# Patient Record
Sex: Female | Born: 1935 | Race: Black or African American | Hispanic: No | Smoking: Never smoker
Health system: Southern US, Community
[De-identification: ages and names within clinical notes are randomized; demographics above are authoritative.]

## PROBLEM LIST (undated history)

## (undated) DIAGNOSIS — R011 Cardiac murmur, unspecified: Secondary | ICD-10-CM

## (undated) DIAGNOSIS — T7840XA Allergy, unspecified, initial encounter: Secondary | ICD-10-CM

## (undated) DIAGNOSIS — I2510 Atherosclerotic heart disease of native coronary artery without angina pectoris: Secondary | ICD-10-CM

## (undated) DIAGNOSIS — I10 Essential (primary) hypertension: Secondary | ICD-10-CM

## (undated) DIAGNOSIS — K5790 Diverticulosis of intestine, part unspecified, without perforation or abscess without bleeding: Secondary | ICD-10-CM

## (undated) DIAGNOSIS — R202 Paresthesia of skin: Secondary | ICD-10-CM

## (undated) DIAGNOSIS — H9313 Tinnitus, bilateral: Secondary | ICD-10-CM

## (undated) DIAGNOSIS — H40009 Preglaucoma, unspecified, unspecified eye: Secondary | ICD-10-CM

## (undated) DIAGNOSIS — R413 Other amnesia: Secondary | ICD-10-CM

## (undated) DIAGNOSIS — Z8619 Personal history of other infectious and parasitic diseases: Secondary | ICD-10-CM

## (undated) DIAGNOSIS — R7303 Prediabetes: Secondary | ICD-10-CM

## (undated) DIAGNOSIS — R634 Abnormal weight loss: Secondary | ICD-10-CM

## (undated) HISTORY — DX: Personal history of other infectious and parasitic diseases: Z86.19

## (undated) HISTORY — DX: Paresthesia of skin: R20.2

## (undated) HISTORY — DX: Tinnitus, bilateral: H93.13

## (undated) HISTORY — DX: Abnormal weight loss: R63.4

## (undated) HISTORY — DX: Cardiac murmur, unspecified: R01.1

## (undated) HISTORY — DX: Atherosclerotic heart disease of native coronary artery without angina pectoris: I25.10

## (undated) HISTORY — DX: Prediabetes: R73.03

## (undated) HISTORY — DX: Allergy, unspecified, initial encounter: T78.40XA

## (undated) HISTORY — DX: Other amnesia: R41.3

## (undated) HISTORY — DX: Essential (primary) hypertension: I10

## (undated) HISTORY — DX: Diverticulosis of intestine, part unspecified, without perforation or abscess without bleeding: K57.90

## (undated) HISTORY — DX: Preglaucoma, unspecified, unspecified eye: H40.009

---

## 2002-01-12 LAB — HM COLONOSCOPY

## 2007-11-30 LAB — HM DEXA SCAN

## 2007-12-12 ENCOUNTER — Ambulatory Visit: Payer: Self-pay | Admitting: Family Medicine

## 2007-12-13 ENCOUNTER — Ambulatory Visit: Payer: Self-pay | Admitting: Internal Medicine

## 2008-04-12 DIAGNOSIS — R634 Abnormal weight loss: Secondary | ICD-10-CM

## 2009-11-12 ENCOUNTER — Ambulatory Visit: Payer: Self-pay | Admitting: Family Medicine

## 2010-09-20 LAB — HM MAMMOGRAPHY: HM Mammogram: NORMAL

## 2010-09-30 ENCOUNTER — Ambulatory Visit: Payer: Self-pay | Admitting: Family Medicine

## 2012-01-12 ENCOUNTER — Emergency Department: Payer: Self-pay | Admitting: Emergency Medicine

## 2013-04-19 LAB — LIPID PANEL
Cholesterol: 171 mg/dL (ref 0–200)
HDL: 103 mg/dL — AB (ref 35–70)
LDL Cholesterol: 59 mg/dL
Triglycerides: 46 mg/dL (ref 40–160)

## 2013-04-19 LAB — HEMOGLOBIN A1C: Hgb A1c MFr Bld: 6.3 % — AB (ref 4.0–6.0)

## 2013-11-30 ENCOUNTER — Emergency Department: Payer: Self-pay | Admitting: Emergency Medicine

## 2013-11-30 LAB — CBC WITH DIFFERENTIAL/PLATELET
Basophil #: 0.1 10*3/uL (ref 0.0–0.1)
Basophil %: 1 %
Eosinophil #: 0 10*3/uL (ref 0.0–0.7)
Eosinophil %: 0.1 %
HCT: 35.5 % (ref 35.0–47.0)
HGB: 11.8 g/dL — ABNORMAL LOW (ref 12.0–16.0)
LYMPHS ABS: 1.4 10*3/uL (ref 1.0–3.6)
Lymphocyte %: 15.5 %
MCH: 30.2 pg (ref 26.0–34.0)
MCHC: 33.3 g/dL (ref 32.0–36.0)
MCV: 91 fL (ref 80–100)
MONOS PCT: 8.5 %
Monocyte #: 0.8 x10 3/mm (ref 0.2–0.9)
NEUTROS ABS: 6.8 10*3/uL — AB (ref 1.4–6.5)
Neutrophil %: 74.9 %
PLATELETS: 234 10*3/uL (ref 150–440)
RBC: 3.91 10*6/uL (ref 3.80–5.20)
RDW: 13.3 % (ref 11.5–14.5)
WBC: 9.1 10*3/uL (ref 3.6–11.0)

## 2014-12-18 ENCOUNTER — Encounter: Payer: Self-pay | Admitting: Family Medicine

## 2014-12-18 DIAGNOSIS — R202 Paresthesia of skin: Secondary | ICD-10-CM | POA: Insufficient documentation

## 2014-12-18 DIAGNOSIS — H9313 Tinnitus, bilateral: Secondary | ICD-10-CM | POA: Insufficient documentation

## 2014-12-18 DIAGNOSIS — E785 Hyperlipidemia, unspecified: Secondary | ICD-10-CM | POA: Insufficient documentation

## 2014-12-18 DIAGNOSIS — R7303 Prediabetes: Secondary | ICD-10-CM | POA: Insufficient documentation

## 2014-12-18 DIAGNOSIS — H40009 Preglaucoma, unspecified, unspecified eye: Secondary | ICD-10-CM | POA: Insufficient documentation

## 2014-12-18 DIAGNOSIS — K5730 Diverticulosis of large intestine without perforation or abscess without bleeding: Secondary | ICD-10-CM | POA: Insufficient documentation

## 2014-12-18 DIAGNOSIS — D72819 Decreased white blood cell count, unspecified: Secondary | ICD-10-CM | POA: Insufficient documentation

## 2014-12-18 DIAGNOSIS — I2510 Atherosclerotic heart disease of native coronary artery without angina pectoris: Secondary | ICD-10-CM | POA: Insufficient documentation

## 2014-12-18 DIAGNOSIS — R011 Cardiac murmur, unspecified: Secondary | ICD-10-CM | POA: Insufficient documentation

## 2014-12-18 DIAGNOSIS — R000 Tachycardia, unspecified: Secondary | ICD-10-CM | POA: Insufficient documentation

## 2014-12-18 DIAGNOSIS — I10 Essential (primary) hypertension: Secondary | ICD-10-CM | POA: Insufficient documentation

## 2014-12-18 DIAGNOSIS — J309 Allergic rhinitis, unspecified: Secondary | ICD-10-CM | POA: Insufficient documentation

## 2014-12-18 DIAGNOSIS — F0390 Unspecified dementia without behavioral disturbance: Secondary | ICD-10-CM | POA: Insufficient documentation

## 2014-12-18 DIAGNOSIS — K219 Gastro-esophageal reflux disease without esophagitis: Secondary | ICD-10-CM | POA: Insufficient documentation

## 2014-12-18 DIAGNOSIS — Z8619 Personal history of other infectious and parasitic diseases: Secondary | ICD-10-CM | POA: Insufficient documentation

## 2014-12-19 ENCOUNTER — Ambulatory Visit (INDEPENDENT_AMBULATORY_CARE_PROVIDER_SITE_OTHER): Payer: Federal, State, Local not specified - PPO | Admitting: Family Medicine

## 2014-12-19 ENCOUNTER — Encounter: Payer: Self-pay | Admitting: Family Medicine

## 2014-12-19 VITALS — BP 134/76 | HR 84 | Temp 98.2°F | Resp 18 | Ht 59.0 in | Wt 114.1 lb

## 2014-12-19 DIAGNOSIS — R202 Paresthesia of skin: Secondary | ICD-10-CM

## 2014-12-19 DIAGNOSIS — D72819 Decreased white blood cell count, unspecified: Secondary | ICD-10-CM

## 2014-12-19 DIAGNOSIS — F0391 Unspecified dementia with behavioral disturbance: Secondary | ICD-10-CM | POA: Diagnosis not present

## 2014-12-19 DIAGNOSIS — K219 Gastro-esophageal reflux disease without esophagitis: Secondary | ICD-10-CM | POA: Diagnosis not present

## 2014-12-19 DIAGNOSIS — I10 Essential (primary) hypertension: Secondary | ICD-10-CM | POA: Diagnosis not present

## 2014-12-19 DIAGNOSIS — E785 Hyperlipidemia, unspecified: Secondary | ICD-10-CM | POA: Diagnosis not present

## 2014-12-19 MED ORDER — OLMESARTAN-AMLODIPINE-HCTZ 40-10-25 MG PO TABS: 1.0000 | ORAL_TABLET | Freq: Every day | ORAL | Status: DC

## 2014-12-19 MED ORDER — LABETALOL HCL 200 MG PO TABS: 200.0000 mg | ORAL_TABLET | Freq: Two times a day (BID) | ORAL | Status: DC

## 2014-12-19 NOTE — Progress Notes (Signed)
Name: Christine LemmingHelen L Lin   MRN: [id number]    DOB: 1932/06/07   Date:12/19/2014       Progress Note  Subjective  Chief Complaint  Chief Complaint  Patient presents with  . Hand Pain    Bilateral hand pain constant for the past 6 month feels like sandpaper, patient could not grip her hands all the way close. Feels like the pain is radiating up her arms and feels tingling up her arms.  . Memory Loss    worstening with time, weather, seasons.    HPI  Dementia with behavior problems: she moved in with one of her daughter's ( Paulette) about 7 months ago. She has been forgetting the stove one, hides her food, has episodes of confusion and irritability. No bowel or bladder incontinence. She stays home by herself during the day , but has not been wondering. Stays inside the house. Symptoms are getting progressively worse. We have been ordering labs since last year but never got it done. She has gained weight since moved in with her daughter  Paresthesia: both hands feels like sandpaper, going on for over one year. B12 was ordered by never done.   HTN: taking medications by herself. Daughter checks after her. BP has been at goal, denies hypotension  GERD: she has stopped taking Ranitidine, denies GERD symptoms    Patient Active Problem List   Diagnosis Date Noted  . Cardiovascular disease 12/18/2014  . Diverticulosis of colon 12/18/2014  . Acid reflux 12/18/2014  . Benign essential HTN 12/18/2014  . Glaucoma suspect 12/18/2014  . Dementia 12/18/2014  . Hand paresthesia 12/18/2014  . Cardiac murmur 12/18/2014  . History of shingles 12/18/2014  . HLD (hyperlipidemia) 12/18/2014  . Leukocytopenia 12/18/2014  . Borderline diabetes 12/18/2014  . Allergic rhinitis 12/18/2014  . Bilateral tinnitus 12/18/2014  . Decreased body weight 04/12/2008    History reviewed. No pertinent past surgical history.  Family History  Problem Relation Age of Onset  . Cancer Mother   . Cancer Sister      Brain  . Heart disease Brother   . Diabetes Daughter   . Hypertension Daughter     History   Social History  . Marital Status: Widowed    Spouse Name: N/A  . Number of Children: N/A  . Years of Education: N/A   Occupational History  . Not on file.   Social History Main Topics  . Smoking status: Never Smoker   . Smokeless tobacco: Never Used  . Alcohol Use: No  . Drug Use: No  . Sexual Activity: No   Other Topics Concern  . Not on file   Social History Narrative     Current outpatient prescriptions:  .  Cholecalciferol (VITAMIN D) 2000 UNITS CAPS, Take by mouth., Disp: , Rfl:  .  diclofenac sodium (VOLTAREN) 1 % GEL, Place onto the skin., Disp: , Rfl:  .  labetalol (NORMODYNE) 200 MG tablet, Take 1 tablet (200 mg total) by mouth 2 (two) times daily., Disp: 60 tablet, Rfl: 5 .  loratadine (CLARITIN) 10 MG tablet, Take by mouth., Disp: , Rfl:  .  Olmesartan-Amlodipine-HCTZ (TRIBENZOR) 40-10-25 MG TABS, Take 1 tablet by mouth daily., Disp: 30 tablet, Rfl: 5 .  mirtazapine (REMERON) 15 MG tablet, Take by mouth., Disp: , Rfl:   Allergies  Allergen Reactions  . Sulfa Antibiotics      ROS  Constitutional: Negative for fever positive for  weight change - gain.  Respiratory: Negative for cough and shortness  of breath.   Cardiovascular: Negative for chest pain or palpitations.  Gastrointestinal: Negative for abdominal pain, no bowel changes.  Musculoskeletal: Negative for gait problem or joint swelling.  Skin: Negative for rash.  Neurological: Negative for dizziness ( occasionally when she gets up quickly )or headache.  No other specific complaints in a complete review of systems (except as listed in HPI above).  Objective  Filed Vitals:   12/19/14 1124 12/19/14 1218  BP: 134/76   Pulse: 110 84  Temp: 98.2 F (36.8 C)   TempSrc: Oral   Resp: 18   Height:  (1.499 m)   Weight: 114 lb 1.6 oz (51.755 kg)   SpO2: 98%     Body mass index is 23.03  kg/(m^2).  Physical Exam  Constitutional: Patient appears well-developed and well-nourished. No distress.  Eyes:  No scleral icterus.  Neck: Normal range of motion. Neck supple. Cardiovascular: Normal rate, regular rhythm and normal heart sounds.  No murmur heard. No BLE edema. Pulmonary/Chest: Effort normal and breath sounds normal. No respiratory distress. Abdominal: Soft.  There is no tenderness. Psychiatric: Patient has a normal mood and affect. behavior is normal. She was confused given some of the information.   PHQ2/9: Depression screen PHQ 2/9 12/19/2014  Decreased Interest 0  Down, Depressed, Hopeless 0  PHQ - 2 Score 0   MMS test: 22  Fall Risk: Fall Risk  12/19/2014  Falls in the past year? No     Assessment & Plan  1. Leukocytopenia  - CBC with Differential  2. HLD (hyperlipidemia) Not fasting, check next time  3. Hand paresthesia, unspecified laterality  - B12  4. Benign essential HTN Occasionally gets dizzy, advised to get up slowly, may dc labetolol if symptoms persists - Comprehensive Metabolic Panel (CMET) - Olmesartan-Amlodipine-HCTZ (TRIBENZOR) 40-10-25 MG TABS; Take 1 tablet by mouth daily.  Dispense: 30 tablet; Refill: 5 - labetalol (NORMODYNE) 200 MG tablet; Take 1 tablet (200 mg total) by mouth 2 (two) times daily.  Dispense: 60 tablet; Refill: 5  5. Gastroesophageal reflux disease without esophagitis Doing well at this time  6. Dementia, with behavioral disturbance Discussed nursing home placement with her daughter Yehuda Mao ), explained the risk of her being home alone. She states her mother would not accept that, but explained to her it will be a safer environment for her. We will check labs before starting her on medication. Advised her to seek support groups and we will try getting some home health assistance for her  - TSH - B12 - RPR - Ambulatory referral to Home Health

## 2014-12-19 NOTE — Patient Instructions (Signed)

## 2014-12-20 ENCOUNTER — Other Ambulatory Visit: Payer: Self-pay | Admitting: Family Medicine

## 2014-12-20 LAB — COMPREHENSIVE METABOLIC PANEL
ALBUMIN: 4.3 g/dL (ref 3.5–4.7)
ALK PHOS: 51 IU/L (ref 39–117)
ALT: 13 IU/L (ref 0–32)
AST: 21 IU/L (ref 0–40)
Albumin/Globulin Ratio: 1.2 (ref 1.1–2.5)
BILIRUBIN TOTAL: 0.6 mg/dL (ref 0.0–1.2)
BUN / CREAT RATIO: 22 (ref 11–26)
BUN: 21 mg/dL (ref 8–27)
CO2: 33 mmol/L — ABNORMAL HIGH (ref 18–29)
CREATININE: 0.94 mg/dL (ref 0.57–1.00)
Calcium: 9.8 mg/dL (ref 8.7–10.3)
Chloride: 94 mmol/L — ABNORMAL LOW (ref 97–108)
GFR calc non Af Amer: 57 mL/min/{1.73_m2} — ABNORMAL LOW (ref >59)
GFR, EST AFRICAN AMERICAN: 66 mL/min/{1.73_m2} (ref >59)
Globulin, Total: 3.5 g/dL (ref 1.5–4.5)
Glucose: 100 mg/dL — ABNORMAL HIGH (ref 65–99)
Potassium: 4.6 mmol/L (ref 3.5–5.2)
Sodium: 142 mmol/L (ref 134–144)
Total Protein: 7.8 g/dL (ref 6.0–8.5)

## 2014-12-20 LAB — CBC WITH DIFFERENTIAL/PLATELET
BASOS: 1 %
Basophils Absolute: 0 10*3/uL (ref 0.0–0.2)
EOS (ABSOLUTE): 0 10*3/uL (ref 0.0–0.4)
EOS: 1 %
Hematocrit: 39.9 % (ref 34.0–46.6)
Hemoglobin: 13.1 g/dL (ref 11.1–15.9)
IMMATURE GRANS (ABS): 0 10*3/uL (ref 0.0–0.1)
Immature Granulocytes: 0 %
Lymphocytes Absolute: 1.6 10*3/uL (ref 0.7–3.1)
Lymphs: 41 %
MCH: 29.8 pg (ref 26.6–33.0)
MCHC: 32.8 g/dL (ref 31.5–35.7)
MCV: 91 fL (ref 79–97)
MONOS ABS: 0.3 10*3/uL (ref 0.1–0.9)
Monocytes: 7 %
NEUTROS PCT: 50 %
Neutrophils Absolute: 2 10*3/uL (ref 1.4–7.0)
Platelets: 191 10*3/uL (ref 150–379)
RBC: 4.39 x10E6/uL (ref 3.77–5.28)
RDW: 13.7 % (ref 12.3–15.4)
WBC: 4 10*3/uL (ref 3.4–10.8)

## 2014-12-20 LAB — VITAMIN B12: VITAMIN B 12: 1242 pg/mL — AB (ref 211–946)

## 2014-12-20 LAB — RPR: RPR: NONREACTIVE

## 2014-12-20 LAB — TSH: TSH: 1.63 u[IU]/mL (ref 0.450–4.500)

## 2014-12-20 MED ORDER — DONEPEZIL HCL 5 MG PO TABS: 5.0000 mg | ORAL_TABLET | Freq: Every day | ORAL | Status: DC

## 2014-12-20 NOTE — Telephone Encounter (Signed)
This dose of 5 mg is just for the first month, I can send 90days when I switch to 

## 2014-12-20 NOTE — Progress Notes (Signed)
Patient daughter notified of labs and will pick up new prescription for mother.

## 2014-12-20 NOTE — Telephone Encounter (Signed)
Patient is requesting a 90 day supply

## 2014-12-23 NOTE — Telephone Encounter (Signed)
Patient daughter notified and also called the pharmacy. The pharmacist states if it is on their record they usually get 90 day supply this message for a request of a 90 day supply is automatically sent.

## 2014-12-31 ENCOUNTER — Telehealth: Payer: Self-pay | Admitting: Family Medicine

## 2014-12-31 ENCOUNTER — Other Ambulatory Visit: Payer: Self-pay | Admitting: Family Medicine

## 2014-12-31 MED ORDER — DICLOFENAC SODIUM 1 % TD GEL: 4.0000 g | Freq: Four times a day (QID) | TRANSDERMAL | Status: DC

## 2014-12-31 NOTE — Telephone Encounter (Signed)
[name] (Daughter) states Donnamarie Poagthat Dr. Carlynn PurlSowles was going to prescribe a cream for the patient hand. States that this is something that the patient has been on before. Please send to [facility] [phone]

## 2015-02-05 ENCOUNTER — Ambulatory Visit: Payer: Federal, State, Local not specified - PPO | Admitting: Family Medicine

## 2015-06-05 ENCOUNTER — Emergency Department: Payer: Medicare (Managed Care)

## 2015-06-05 ENCOUNTER — Other Ambulatory Visit: Payer: Self-pay

## 2015-06-05 DIAGNOSIS — I10 Essential (primary) hypertension: Secondary | ICD-10-CM | POA: Insufficient documentation

## 2015-06-05 DIAGNOSIS — S01112A Laceration without foreign body of left eyelid and periocular area, initial encounter: Secondary | ICD-10-CM | POA: Diagnosis not present

## 2015-06-05 DIAGNOSIS — N390 Urinary tract infection, site not specified: Secondary | ICD-10-CM | POA: Insufficient documentation

## 2015-06-05 DIAGNOSIS — Y9389 Activity, other specified: Secondary | ICD-10-CM | POA: Insufficient documentation

## 2015-06-05 DIAGNOSIS — S0990XA Unspecified injury of head, initial encounter: Secondary | ICD-10-CM | POA: Diagnosis present

## 2015-06-05 DIAGNOSIS — Z79899 Other long term (current) drug therapy: Secondary | ICD-10-CM | POA: Insufficient documentation

## 2015-06-05 DIAGNOSIS — W01198A Fall on same level from slipping, tripping and stumbling with subsequent striking against other object, initial encounter: Secondary | ICD-10-CM | POA: Insufficient documentation

## 2015-06-05 DIAGNOSIS — Y998 Other external cause status: Secondary | ICD-10-CM | POA: Diagnosis not present

## 2015-06-05 DIAGNOSIS — Z791 Long term (current) use of non-steroidal anti-inflammatories (NSAID): Secondary | ICD-10-CM | POA: Insufficient documentation

## 2015-06-05 DIAGNOSIS — E860 Dehydration: Secondary | ICD-10-CM | POA: Diagnosis not present

## 2015-06-05 DIAGNOSIS — Y9289 Other specified places as the place of occurrence of the external cause: Secondary | ICD-10-CM | POA: Insufficient documentation

## 2015-06-05 LAB — CBC
HEMATOCRIT: 34.6 % — AB (ref 35.0–47.0)
Hemoglobin: 11.3 g/dL — ABNORMAL LOW (ref 12.0–16.0)
MCH: 29.1 pg (ref 26.0–34.0)
MCHC: 32.6 g/dL (ref 32.0–36.0)
MCV: 89.1 fL (ref 80.0–100.0)
PLATELETS: 153 10*3/uL (ref 150–440)
RBC: 3.88 MIL/uL (ref 3.80–5.20)
RDW: 13.8 % (ref 11.5–14.5)
WBC: 4.7 10*3/uL (ref 3.6–11.0)

## 2015-06-05 LAB — BASIC METABOLIC PANEL
Anion gap: 10 (ref 5–15)
BUN: 49 mg/dL — AB (ref 6–20)
CO2: 32 mmol/L (ref 22–32)
Calcium: 9.5 mg/dL (ref 8.9–10.3)
Chloride: 99 mmol/L — ABNORMAL LOW (ref 101–111)
Creatinine, Ser: 2.25 mg/dL — ABNORMAL HIGH (ref 0.44–1.00)
GFR calc Af Amer: 22 mL/min — ABNORMAL LOW (ref >60)
GFR calc non Af Amer: 19 mL/min — ABNORMAL LOW (ref >60)
GLUCOSE: 148 mg/dL — AB (ref 65–99)
POTASSIUM: 4.5 mmol/L (ref 3.5–5.1)
Sodium: 141 mmol/L (ref 135–145)

## 2015-06-05 LAB — TROPONIN I: Troponin I: <0.03 ng/mL (ref <0.031)

## 2015-06-05 NOTE — ED Notes (Signed)
Pt in with co fall today, unsure of cause.  Does have dementia, lac noted to left eyebrow.  Pt denies any pain at this time, daughters states she has had dizziness recently.

## 2015-06-06 ENCOUNTER — Emergency Department
Admission: EM | Admit: 2015-06-06 | Discharge: 2015-06-06 | Disposition: A | Discharge status: Home / Self Care | Payer: Medicare (Managed Care) | Attending: Emergency Medicine | Admitting: Emergency Medicine

## 2015-06-06 DIAGNOSIS — N390 Urinary tract infection, site not specified: Secondary | ICD-10-CM

## 2015-06-06 DIAGNOSIS — W19XXXA Unspecified fall, initial encounter: Secondary | ICD-10-CM

## 2015-06-06 DIAGNOSIS — N289 Disorder of kidney and ureter, unspecified: Secondary | ICD-10-CM

## 2015-06-06 DIAGNOSIS — E860 Dehydration: Secondary | ICD-10-CM

## 2015-06-06 DIAGNOSIS — IMO0002 Reserved for concepts with insufficient information to code with codable children: Secondary | ICD-10-CM

## 2015-06-06 LAB — URINALYSIS COMPLETE WITH MICROSCOPIC (ARMC ONLY)
Bilirubin Urine: NEGATIVE
GLUCOSE, UA: 50 mg/dL — AB
HGB URINE DIPSTICK: NEGATIVE
KETONES UR: NEGATIVE mg/dL
Nitrite: NEGATIVE
Protein, ur: 30 mg/dL — AB
RBC / HPF: NONE SEEN RBC/hpf (ref 0–5)
SPECIFIC GRAVITY, URINE: 1.016 (ref 1.005–1.030)
pH: 6 (ref 5.0–8.0)

## 2015-06-06 MED ORDER — NITROFURANTOIN MONOHYD MACRO 100 MG PO CAPS
100.0000 mg | ORAL_CAPSULE | Freq: Once | ORAL | Status: AC
  Administered 2015-06-06: 100 mg via ORAL
  Filled 2015-06-06: qty 1

## 2015-06-06 MED ORDER — NITROFURANTOIN MONOHYD MACRO 100 MG PO CAPS: 100.0000 mg | ORAL_CAPSULE | Freq: Two times a day (BID) | ORAL | Status: DC

## 2015-06-06 MED ORDER — SODIUM CHLORIDE 0.9 % IV BOLUS (SEPSIS)
1000.0000 mL | Freq: Once | INTRAVENOUS | Status: AC
  Administered 2015-06-06: 1000 mL via INTRAVENOUS

## 2015-06-06 NOTE — Discharge Instructions (Signed)
1. Drink plenty of fluids daily. 2. Take antibiotics as prescribed (Macrobid 100 mg twice daily 7 days). 3. Medical tape should follow off in approximately one week; you may remove after 7 days. 4. Return to the ER for worsening symptoms, fever, persistent vomiting or other concerns.  Dehydration Dehydration is when you lose more fluids from the body than you take in. Vital organs such as the kidneys, brain, and heart cannot function without a proper amount of fluids and salt. Any loss of fluids from the body can cause dehydration.  Older adults are at a higher risk of dehydration than younger adults. As we age, our bodies are less able to conserve water and do not respond to temperature changes as well. Also, older adults do not become thirsty as easily or quickly. Because of this, older adults often do not realize they need to increase fluids to avoid dehydration.  CAUSES   Vomiting.  Diarrhea.  Excessive sweating.  Excessive urination.  Fever.  Certain medicines, such as blood pressure medicines called diuretics.  Poorly controlled blood sugars. SIGNS AND SYMPTOMS  Mild dehydration:  Thirst.  Dry lips.  Slightly dry mouth. Moderate dehydration:  Very dry mouth.  Sunken eyes.  Skin does not bounce back quickly when lightly pinched and released.  Dark urine and decreased urine production.  Decreased tear production.  Headache. Severe dehydration:  Very dry mouth.  Extreme thirst.  Rapid, weak pulse (more than 100 beats per minute at rest).  Cold hands and feet.  Not able to sweat in spite of heat.  Rapid breathing.  Blue lips.  Confusion and lethargy.  Difficulty being awakened.  Minimal urine production.  No tears. DIAGNOSIS  Your health care provider will diagnose dehydration based on your symptoms and your exam. Blood and urine tests will help confirm the diagnosis. The diagnostic evaluation should also identify the cause of  dehydration. TREATMENT  Treatment of mild or moderate dehydration can often be done at home by increasing the amount of fluids that you drink. It is best to drink small amounts of fluid more often. Drinking too much at one time can make vomiting worse. Severe dehydration needs to be treated at the hospital. You may be given IV fluids that contain water and electrolytes. HOME CARE INSTRUCTIONS   Ask your health care provider about specific rehydration instructions.  Drink enough fluids to keep your urine clear or pale yellow.  Drink small amounts frequently if you have nausea and vomiting.  Eat as you normally do.  Avoid:  Foods or drinks high in sugar.  Carbonated drinks.  Juice.  Extremely hot or cold fluids.  Drinks with caffeine.  Fatty, greasy foods.  Alcohol.  Tobacco.  Overeating.  Gelatin desserts.  Wash your hands well to avoid spreading bacteria and viruses.  Only take over-the-counter or prescription medicines for pain, discomfort, or fever as directed by your health care provider.  Ask your health care provider if you should continue all prescribed and over-the-counter medicines.  Keep all follow-up appointments with your health care provider. SEEK MEDICAL CARE IF:  You have abdominal pain, and it increases or stays in one area (localizes).  You have a rash, stiff neck, or severe headache.  You are irritable, sleepy, or difficult to awaken.  You are weak, dizzy, or extremely thirsty.  You have a fever. SEEK IMMEDIATE MEDICAL CARE IF:   You are unable to keep fluids down, or you get worse despite treatment.  You have frequent episodes of  vomiting or diarrhea.  You have blood or green matter (bile) in your vomit.  You have blood in your stool, or your stool looks black and tarry.  You have not urinated in 6-8 hours, or you have only urinated a small amount of very dark urine.  You faint. MAKE SURE YOU:   Understand these  instructions.  Will watch your condition.  Will get help right away if you are not doing well or get worse.   This information is not intended to replace advice given to you by your health care provider. Make sure you discuss any questions you have with your health care provider.   Document Released: 07-17-202005 Document Revised: 06/05/2013 Document Reviewed: 02/05/2013 Elsevier Interactive Patient Education 2016 Elsevier Inc.  Urinary Tract Infection Urinary tract infections (UTIs) can develop anywhere along your urinary tract. Your urinary tract is your body's drainage system for removing wastes and extra water. Your urinary tract includes two kidneys, two ureters, a bladder, and a urethra. Your kidneys are a pair of bean-shaped organs. Each kidney is about the size of your fist. They are located below your ribs, one on each side of your spine. CAUSES Infections are caused by microbes, which are microscopic organisms, including fungi, viruses, and bacteria. These organisms are so small that they can only be seen through a microscope. Bacteria are the microbes that most commonly cause UTIs. SYMPTOMS  Symptoms of UTIs may vary by age and gender of the patient and by the location of the infection. Symptoms in young women typically include a frequent and intense urge to urinate and a painful, burning feeling in the bladder or urethra during urination. Older women and men are more likely to be tired, shaky, and weak and have muscle aches and abdominal pain. A fever may mean the infection is in your kidneys. Other symptoms of a kidney infection include pain in your back or sides below the ribs, nausea, and vomiting. DIAGNOSIS To diagnose a UTI, your caregiver will ask you about your symptoms. Your caregiver will also ask you to provide a urine sample. The urine sample will be tested for bacteria and white blood cells. White blood cells are made by your body to help fight infection. TREATMENT   Typically, UTIs can be treated with medication. Because most UTIs are caused by a bacterial infection, they usually can be treated with the use of antibiotics. The choice of antibiotic and length of treatment depend on your symptoms and the type of bacteria causing your infection. HOME CARE INSTRUCTIONS  If you were prescribed antibiotics, take them exactly as your caregiver instructs you. Finish the medication even if you feel better after you have only taken some of the medication.  Drink enough water and fluids to keep your urine clear or pale yellow.  Avoid caffeine, tea, and carbonated beverages. They tend to irritate your bladder.  Empty your bladder often. Avoid holding urine for long periods of time.  Empty your bladder before and after sexual intercourse.  After a bowel movement, women should cleanse from front to back. Use each tissue only once. SEEK MEDICAL CARE IF:   You have back pain.  You develop a fever.  Your symptoms do not begin to resolve within 3 days. SEEK IMMEDIATE MEDICAL CARE IF:   You have severe back pain or lower abdominal pain.  You develop chills.  You have nausea or vomiting.  You have continued burning or discomfort with urination. MAKE SURE YOU:   Understand these instructions.  Will watch your condition.  Will get help right away if you are not doing well or get worse.   This information is not intended to replace advice given to you by your health care provider. Make sure you discuss any questions you have with your health care provider.   Document Released: 03/10/2005 Document Revised: 02/19/2015 Document Reviewed: 07/09/2011 Elsevier Interactive Patient Education [employer]2016 Elsevier Inc.

## 2015-06-06 NOTE — ED Provider Notes (Signed)
[facility] Emergency Department Provider Note  ____________________________________________  Time seen: Approximately 12:58 AM  I have reviewed the triage vital signs and the nursing notes.   HISTORY  Chief Complaint Head Injury    HPI Christine Lin is a 81 y.o. female presents to the ED from home with a chief complaint of fall with left eyebrow laceration. Patient states she was ambulating to her freezer to get some ice cream which she lost her balance and fell, striking her forehead resulting in left eyebrow laceration. Incident occurred approximately 9 PM. Daughters witnessed the fall and deny LOC. Patient voices no complaints of headache, dizziness, vision changes, chest pain, shortness of breath, abdominal pain, nausea, vomiting, diarrhea.Daughter states patient has been complaining of dizziness recently. Patient denies. Denies recent travel. Denies recent fever, chills, malaise, cough, dysuria.   Past Medical History  Diagnosis Date  . History of herpes zoster   . Heart murmur   . Allergy   . Hypertension   . Prediabetes   . Memory loss   . Hand paresthesia   . Tinnitus of both ears   . ASCVD (arteriosclerotic cardiovascular disease)   . Glaucoma suspect   . Diverticulosis   . Abnormal weight loss     Patient Active Problem List   Diagnosis Date Noted  . Cardiovascular disease 12/18/2014  . Diverticulosis of colon 12/18/2014  . Acid reflux 12/18/2014  . Benign essential HTN 12/18/2014  . Glaucoma suspect 12/18/2014  . Dementia 12/18/2014  . Hand paresthesia 12/18/2014  . Cardiac murmur 12/18/2014  . History of shingles 12/18/2014  . HLD (hyperlipidemia) 12/18/2014  . Leukocytopenia 12/18/2014  . Borderline diabetes 12/18/2014  . Allergic rhinitis 12/18/2014  . Bilateral tinnitus 12/18/2014  . Decreased body weight 04/12/2008    No past surgical history on file.  Current Outpatient Rx  Name  Route  Sig  Dispense  Refill  .  Cholecalciferol (VITAMIN D) 2000 UNITS CAPS   Oral   Take by mouth.         . diclofenac sodium (VOLTAREN) 1 % GEL   Topical   Apply 4 g topically 4 (four) times daily.   100 g   5   . donepezil (ARICEPT) 5 MG tablet      TAKE 1 TABLET BY MOUTH EVERY NIGHT AT BEDTIME   90 tablet   0     **Patient requests 90 days supply**   . labetalol (NORMODYNE) 200 MG tablet   Oral   Take 1 tablet (200 mg total) by mouth 2 (two) times daily.   60 tablet   5   . loratadine (CLARITIN) 10 MG tablet   Oral   Take by mouth.         . mirtazapine (REMERON) 15 MG tablet   Oral   Take by mouth.         . Olmesartan-Amlodipine-HCTZ (TRIBENZOR) 40-10-25 MG TABS   Oral   Take 1 tablet by mouth daily.   30 tablet   5     Allergies Sulfa antibiotics  Family History  Problem Relation Age of Onset  . Cancer Mother   . Cancer Sister     Brain  . Heart disease Brother   . Diabetes Daughter   . Hypertension Daughter     Social History Social History  Substance Use Topics  . Smoking status: Never Smoker   . Smokeless tobacco: Never Used  . Alcohol Use: No    Review of Systems Constitutional:  No fever/chills Eyes: No visual changes. ENT: Positive for left eyebrow laceration. No sore throat. Cardiovascular: Denies chest pain. Respiratory: Denies shortness of breath. Gastrointestinal: No abdominal pain.  No nausea, no vomiting.  No diarrhea.  No constipation. Genitourinary: Negative for dysuria. Musculoskeletal: Negative for back pain. Skin: Negative for rash. Neurological: Negative for headaches, focal weakness or numbness.  10-point ROS otherwise negative.  ____________________________________________   PHYSICAL EXAM:  VITAL SIGNS: ED Triage Vitals  Enc Vitals Group     BP 06/05/15 2230 124/74 mmHg     Pulse Rate 06/05/15 2230 74     Resp 06/05/15 2230 18     Temp 06/05/15 2230 98.2 F (36.8 C)     Temp Source 06/05/15 2230 Oral     SpO2 06/06/15 0030 98 %      Weight 06/05/15 2230 120 lb (54.432 kg)     Height 06/05/15 2230  (1.549 m)     Head Cir --      Peak Flow --      Pain Score --      Pain Loc --      Pain Edu? --      Excl. in GC? --     Constitutional: Alert and oriented. Well appearing and in no acute distress. Eyes: Conjunctivae are normal. PERRL. EOMI. Head: Approximately 1.5 cm linear, superficial, nonbleeding, well approximated laceration above left eyebrow. Nose: No congestion/rhinnorhea. Mouth/Throat: Mucous membranes are mildly dry.  Oropharynx non-erythematous. Neck: No stridor.  No cervical spine tenderness to palpation.  No step-offs or deformities noted. Cardiovascular: Normal rate, regular rhythm. Grossly normal heart sounds.  Good peripheral circulation. Respiratory: Normal respiratory effort.  No retractions. Lungs CTAB. Gastrointestinal: Soft and nontender. No distention. No abdominal bruits. No CVA tenderness. Musculoskeletal: No lower extremity tenderness nor edema.  No joint effusions. Neurologic:  Normal speech and language. Alert and oriented to person and place which is baseline for patient. CN II-XII intact. 5/5 motor strength and sensation all extremities. No gross focal neurologic deficits are appreciated.  Skin:  Skin is warm, and intact. No rash noted. Psychiatric: Mood and affect are normal. Speech and behavior are normal.  ____________________________________________   LABS (all labs ordered are listed, but only abnormal results are displayed)  Labs Reviewed  CBC - Abnormal; Notable for the following:    Hemoglobin 11.3 (*)    HCT 34.6 (*)    All other components within normal limits  BASIC METABOLIC PANEL - Abnormal; Notable for the following:    Chloride 99 (*)    Glucose, Bld 148 (*)    BUN 49 (*)    Creatinine, Ser 2.25 (*)    GFR calc non Af Amer 19 (*)    GFR calc Af Amer 22 (*)    All other components within normal limits  URINALYSIS COMPLETEWITH MICROSCOPIC ([facility] ONLY) -  Abnormal; Notable for the following:    Color, Urine YELLOW (*)    APPearance HAZY (*)    Glucose, UA 50 (*)    Protein, ur 30 (*)    Leukocytes, UA 3+ (*)    Bacteria, UA RARE (*)    Squamous Epithelial / LPF 0-5 (*)    All other components within normal limits  TROPONIN I   ____________________________________________  EKG  ED ECG REPORT I, SUNG,JADE J, the attending physician, personally viewed and interpreted this ECG.   Date: 06/06/2015  EKG Time: 2250  Rate: 77  Rhythm: normal EKG, normal sinus rhythm  Axis: Normal  Intervals:none  ST&T Change: Nonspecific  ____________________________________________  RADIOLOGY  CT head without contrast interpreted per Dr. Gwenyth Benderadparvar: No acute intracranial hemorrhage.  Age-related atrophy and chronic microvascular ischemic disease.  ____________________________________________   PROCEDURES  Procedure(s) performed:   LACERATION REPAIR Performed by: Irean HongSUNG,JADE J Authorized by: Irean HongSUNG,JADE J Consent: Verbal consent obtained. Risks and benefits: risks, benefits and alternatives were discussed Consent given by: patient Patient identity confirmed: provided demographic data Prepped and Draped in normal sterile fashion Wound explored  Laceration Location: left eyebrow  Laceration Length: 1.5cm  No Foreign Bodies seen or palpated  Anesthesia: None  Amount of cleaning: standard  Skin closure: Dermabond + strips  Technique: Standard  Patient tolerance: Patient tolerated the procedure well with no immediate complications.  Critical Care performed: No  ____________________________________________   INITIAL IMPRESSION / ASSESSMENT AND PLAN / ED COURSE  Pertinent labs & imaging results that were available during my care of the patient were reviewed by me and considered in my medical decision making (see chart for details).  81 year old female presents s/p mechanical fall with left eyebrow laceration. Patient tolerated  Dermabond and Steri-Strips well. Labs notable for renal insufficiency which is new compared to July 2016. Will initiate IV fluid resuscitation. Family state tetanus is up-to-date. Updated them of negative imaging study for intracranial hemorrhage. Await urine specimen.  ----------------------------------------- 3:01 AM on 06/06/2015 -----------------------------------------  IV fluids completed. Patient does not feel like she can provide urine specimen. Water given to patient. Offered in and out catheter but patient declines.  ----------------------------------------- 4:39 AM on 06/06/2015 -----------------------------------------  Updated patient and daughters of urinalysis results. Will initiate antibiotic treatment with Macrobid; urine culture added. Strict return precautions given. All verbalize understanding and agree with plan of care. ____________________________________________   FINAL CLINICAL IMPRESSION(S) / ED DIAGNOSES  Final diagnoses:  Fall, initial encounter  Dehydration  Laceration  Renal insufficiency  UTI (lower urinary tract infection)      Irean HongJade J Sung, MD 06/06/15 0700

## 2017-04-23 ENCOUNTER — Emergency Department
Admission: EM | Admit: 2017-04-23 | Discharge: 2017-04-23 | Disposition: A | Discharge status: Home / Self Care | Payer: Medicare (Managed Care) | Attending: Emergency Medicine | Admitting: Emergency Medicine

## 2017-04-23 ENCOUNTER — Emergency Department: Payer: Medicare (Managed Care)

## 2017-04-23 DIAGNOSIS — R55 Syncope and collapse: Secondary | ICD-10-CM | POA: Insufficient documentation

## 2017-04-23 DIAGNOSIS — F0390 Unspecified dementia without behavioral disturbance: Secondary | ICD-10-CM | POA: Diagnosis not present

## 2017-04-23 DIAGNOSIS — I10 Essential (primary) hypertension: Secondary | ICD-10-CM | POA: Diagnosis not present

## 2017-04-23 DIAGNOSIS — I2510 Atherosclerotic heart disease of native coronary artery without angina pectoris: Secondary | ICD-10-CM | POA: Insufficient documentation

## 2017-04-23 DIAGNOSIS — Z79899 Other long term (current) drug therapy: Secondary | ICD-10-CM | POA: Diagnosis not present

## 2017-04-23 DIAGNOSIS — E876 Hypokalemia: Secondary | ICD-10-CM | POA: Insufficient documentation

## 2017-04-23 LAB — URINALYSIS, COMPLETE (UACMP) WITH MICROSCOPIC
Bilirubin Urine: NEGATIVE
GLUCOSE, UA: NEGATIVE mg/dL
Ketones, ur: NEGATIVE mg/dL
LEUKOCYTES UA: NEGATIVE
NITRITE: NEGATIVE
PH: 7 (ref 5.0–8.0)
PROTEIN: 30 mg/dL — AB
SPECIFIC GRAVITY, URINE: 1.01 (ref 1.005–1.030)
Squamous Epithelial / LPF: NONE SEEN

## 2017-04-23 LAB — BASIC METABOLIC PANEL
Anion gap: 14 (ref 5–15)
BUN: 21 mg/dL — ABNORMAL HIGH (ref 6–20)
CALCIUM: 9.1 mg/dL (ref 8.9–10.3)
CO2: 30 mmol/L (ref 22–32)
Chloride: 95 mmol/L — ABNORMAL LOW (ref 101–111)
Creatinine, Ser: 1.08 mg/dL — ABNORMAL HIGH (ref 0.44–1.00)
GFR calc Af Amer: 53 mL/min — ABNORMAL LOW (ref >60)
GFR, EST NON AFRICAN AMERICAN: 46 mL/min — AB (ref >60)
GLUCOSE: 156 mg/dL — AB (ref 65–99)
POTASSIUM: 3.2 mmol/L — AB (ref 3.5–5.1)
SODIUM: 139 mmol/L (ref 135–145)

## 2017-04-23 LAB — CBC
HEMATOCRIT: 37.5 % (ref 35.0–47.0)
Hemoglobin: 12.4 g/dL (ref 12.0–16.0)
MCH: 29.1 pg (ref 26.0–34.0)
MCHC: 32.9 g/dL (ref 32.0–36.0)
MCV: 88.4 fL (ref 80.0–100.0)
PLATELETS: 161 10*3/uL (ref 150–440)
RBC: 4.24 MIL/uL (ref 3.80–5.20)
RDW: 14.3 % (ref 11.5–14.5)
WBC: 5 10*3/uL (ref 3.6–11.0)

## 2017-04-23 LAB — TROPONIN I: Troponin I: <0.03 ng/mL (ref <0.03)

## 2017-04-23 MED ORDER — POTASSIUM CHLORIDE ER 20 MEQ PO TBCR: 20.0000 meq | EXTENDED_RELEASE_TABLET | Freq: Two times a day (BID) | ORAL | 0 refills | Status: DC

## 2017-04-23 MED ORDER — POTASSIUM CHLORIDE 20 MEQ PO PACK
20.0000 meq | PACK | Freq: Once | ORAL | Status: AC
  Administered 2017-04-23: 20 meq via ORAL
  Filled 2017-04-23: qty 1

## 2017-04-23 MED ORDER — SODIUM CHLORIDE 0.9 % IV BOLUS (SEPSIS)
500.0000 mL | Freq: Once | INTRAVENOUS | Status: AC
  Administered 2017-04-23: 500 mL via INTRAVENOUS

## 2017-04-23 NOTE — ED Triage Notes (Signed)
Per ems found unresponsive by family on toilet. Ems states pt with bradycardia on scene, given 0.5mg  of atropine and ns bolus. Pt with history of dementia, alert to self. Per ems allergies to sulfa, fsbs "within normal limits".

## 2017-04-23 NOTE — ED Notes (Signed)

## 2017-04-23 NOTE — Discharge Instructions (Signed)
Take the potassium as prescribed and follow-up with your primary care within the next week to have it rechecked.  Return to the ER for any new, worsening, or recurrent weakness, lightheadedness, passing out or nearly passing out, difficulty breathing, fevers, or any other new or worsening symptoms that concern you.

## 2017-04-23 NOTE — ED Notes (Signed)
Patient transported to CT 

## 2017-04-23 NOTE — ED Provider Notes (Addendum)
[facility] Emergency Department Provider Note ____________________________________________   First MD Initiated Contact with Patient 04/23/17 2106     (approximate)  I have reviewed the triage vital signs and the nursing notes.   HISTORY  Chief Complaint Loss of Consciousness  History of present illness severely limited by dementia.  HPI TRINADEE VERHAGEN is a 83 y.o. female with history of dementia, and other past medical history as noted below who presents with syncope, acute onset while she was on the toilet, and now resolved.  Per EMS, patient was bradycardic on the scene and was given atropine.  Per family, patient has been slightly confused from baseline over the last several days, and more hard of hearing than usual.  She also has had worsening difficulty with mobility.  She has not had vomiting, cough, shortness of breath, or any other specific focal symptoms.   Past Medical History:  Diagnosis Date  . Abnormal weight loss   . Allergy   . ASCVD (arteriosclerotic cardiovascular disease)   . Diverticulosis   . Glaucoma suspect   . Hand paresthesia   . Heart murmur   . History of herpes zoster   . Hypertension   . Memory loss   . Prediabetes   . Tinnitus of both ears     Patient Active Problem List   Diagnosis Date Noted  . Cardiovascular disease 12/18/2014  . Diverticulosis of colon 12/18/2014  . Acid reflux 12/18/2014  . Benign essential HTN 12/18/2014  . Glaucoma suspect 12/18/2014  . Dementia 12/18/2014  . Hand paresthesia 12/18/2014  . Cardiac murmur 12/18/2014  . History of shingles 12/18/2014  . HLD (hyperlipidemia) 12/18/2014  . Leukocytopenia 12/18/2014  . Borderline diabetes 12/18/2014  . Allergic rhinitis 12/18/2014  . Bilateral tinnitus 12/18/2014  . Decreased body weight 04/12/2008    No past surgical history on file.  Prior to Admission medications   Medication Sig Start Date End Date Taking? Authorizing Provider    Cholecalciferol (VITAMIN D) 2000 UNITS CAPS Take by mouth. 03/23/11   [provider]  diclofenac sodium (VOLTAREN) 1 % GEL Apply 4 g topically 4 (four) times daily. 12/31/14   Alba Cory, MD  donepezil (ARICEPT) 5 MG tablet TAKE 1 TABLET BY MOUTH EVERY NIGHT AT BEDTIME 12/23/14   Alba Cory, MD  labetalol (NORMODYNE) 200 MG tablet Take 1 tablet (200 mg total) by mouth 2 (two) times daily. 12/19/14   Alba Cory, MD  loratadine (CLARITIN) 10 MG tablet Take by mouth. 08/25/10   [provider]  mirtazapine (REMERON) 15 MG tablet Take by mouth. 07/06/13   [provider]  nitrofurantoin, macrocrystal-monohydrate, (MACROBID) 100 MG capsule Take 1 capsule (100 mg total) by mouth 2 (two) times daily. 06/06/15   Irean Hong, MD  Olmesartan-Amlodipine-HCTZ [facility]) 40-10-25 MG TABS Take 1 tablet by mouth daily. 12/19/14   Alba Cory, MD    Allergies Sulfa antibiotics  Family History  Problem Relation Age of Onset  . Cancer Mother   . Cancer Sister        Brain  . Heart disease Brother   . Diabetes Daughter   . Hypertension Daughter     Social History Social History   Tobacco Use  . Smoking status: Never Smoker  . Smokeless tobacco: Never Used  Substance Use Topics  . Alcohol use: No    Alcohol/week: 0.0 oz  . Drug use: No    Review of Systems Level V caveat: Unable to obtain review of  systems due to dementia    ____________________________________________   PHYSICAL EXAM:  VITAL SIGNS: ED Triage Vitals [04/23/17 2043]  Enc Vitals Group     BP (!) 143/90     Pulse Rate 80     Resp 12     Temp 97.7 F (36.5 C)     Temp src      SpO2 94 %     Weight 131 lb (59.4 kg)     Height      Head Circumference      Peak Flow      Pain Score      Pain Loc      Pain Edu?      Excl. in GC?     Constitutional: Alert, oriented x1.  Comfortable appearing.  Eyes: Conjunctivae are normal.  EOMI.  PERRLA. Head: Atraumatic. Nose: No  congestion/rhinnorhea. Mouth/Throat: Mucous membranes are somewhat dry.   Neck: Normal range of motion.  Cardiovascular: Normal rate, regular rhythm. Grossly normal heart sounds.  Good peripheral circulation. Respiratory: Normal respiratory effort.  No retractions. Lungs CTAB. Gastrointestinal: Soft and nontender. No distention.  Genitourinary: No CVA tenderness. Musculoskeletal: No lower extremity edema.  Extremities warm and well perfused.  Neurologic:  Normal speech.  Motor intact in all extremities.  Normal coordination.  No gross focal neurologic deficits are appreciated.  Skin:  Skin is warm and dry. No rash noted. Psychiatric: Mood and affect are normal. Speech and behavior are normal.  ____________________________________________   LABS (all labs ordered are listed, but only abnormal results are displayed)  Labs Reviewed  BASIC METABOLIC PANEL - Abnormal; Notable for the following components:      Result Value   Potassium 3.2 (*)    Chloride 95 (*)    Glucose, Bld 156 (*)    BUN 21 (*)    Creatinine, Ser 1.08 (*)    GFR calc non Af Amer 46 (*)    GFR calc Af Amer 53 (*)    All other components within normal limits  URINALYSIS, COMPLETE (UACMP) WITH MICROSCOPIC - Abnormal; Notable for the following components:   Color, Urine YELLOW (*)    APPearance HAZY (*)    Hgb urine dipstick SMALL (*)    Protein, ur 30 (*)    Bacteria, UA RARE (*)    All other components within normal limits  CBC  TROPONIN I   ____________________________________________  EKG  ED ECG REPORT I, Dionne BucySebastian Sophronia Varney, the attending physician, personally viewed and interpreted this ECG.  Date: 04/23/2017 EKG Time: 2049 Rate: approx 75 Rhythm: normal sinus rhythm QRS Axis: normal Intervals: normal ST/T Wave abnormalities: nonspecific Narrative Interpretation: extremely poor baseline, but no ischemic changes, and no significant change from EKG dated  06/06/2015  ____________________________________________  RADIOLOGY  CXR: cardiomegaly but no other acute findings  CT head: no ICH or other acute abnormalities  ____________________________________________   PROCEDURES  Procedure(s) performed: No    Critical Care performed: No ____________________________________________   INITIAL IMPRESSION / ASSESSMENT AND PLAN / ED COURSE  Pertinent labs & imaging results that were available during my care of the patient were reviewed by me and considered in my medical decision making (see chart for details).  83 year old female with history of dementia and other past medical history as noted above presents with syncope while on the toilet today, with associated bradycardia per EMS.  Per family, patient has been slightly weaker and potentially somewhat altered from her baseline over the last several days.  Review of  past medical records in epic is noncontributory; patient last visited this ED in 2016 for a fall.  On exam, vital signs are now normal, patient is relatively well-appearing for her age, and there are no significant exam findings.  Neuro exam is nonfocal.  Syncope likely to straining, however given patient's apparent weakness and/or change in mental status, differential occludes dehydration, electrolyte or other metabolic abnormality, infection such as UTI, and less likely cardiac or CNS cause.  Plan: CT head, basic labs, troponin, infectious workup, fluid bolus, and reassess.   ----------------------------------------- 10:57 PM on 04/23/2017 -----------------------------------------  Lab workup is unremarkable except for borderline low potassium, with patient's last labs showing potassium 4.5 so this is definitely a drop for her.  UA is negative for signs of acute infection, and patient's creatinine is actually improved from most recent labs in Epic from 1 year ago.  Patient remained stable with heart rate in the 50s-60s, and  normal blood pressure.  CT head and chest x-ray also with no remarkable findings.  Most likely patient has some mild weakness from the hypokalemia or slight dehydration, and this combined with straining on the toilet caused a vasovagal episode.  At this time, based on negative workup, there is no occasion for further emergent workup or admission.  I will discharge with prescription for oral potassium.  Patient's family members feel comfortable with taking her home.  I spent 10 minutes discussing the results of the workup in detail with the patient's daughters.  Patient has follow-up with her primary care this week.  Return precautions in, and patient's daughters expressed understanding.     ____________________________________________   FINAL CLINICAL IMPRESSION(S) / ED DIAGNOSES  Final diagnoses:  Syncope, unspecified syncope type  Hypokalemia      NEW MEDICATIONS STARTED DURING THIS VISIT:  This SmartLink is deprecated. Use AVSMEDLIST instead to display the medication list for a patient.   Note:  This document was prepared using Dragon voice recognition software and may include unintentional dictation errors.    Dionne BucySiadecki, Raydell Maners, MD 04/23/17 2300    Dionne BucySiadecki, Kenishia Plack, MD 04/23/17 2303

## 2017-07-27 ENCOUNTER — Other Ambulatory Visit: Payer: Self-pay | Admitting: Family Medicine

## 2017-07-27 DIAGNOSIS — S37009A Unspecified injury of unspecified kidney, initial encounter: Secondary | ICD-10-CM

## 2017-07-29 ENCOUNTER — Ambulatory Visit
Admission: RE | Admit: 2017-07-29 | Discharge: 2017-07-29 | Disposition: A | Discharge status: Home / Self Care | Payer: Medicare (Managed Care) | Source: Ambulatory Visit | Attending: Family Medicine | Admitting: Family Medicine

## 2017-07-29 DIAGNOSIS — X58XXXA Exposure to other specified factors, initial encounter: Secondary | ICD-10-CM | POA: Diagnosis not present

## 2017-07-29 DIAGNOSIS — N281 Cyst of kidney, acquired: Secondary | ICD-10-CM | POA: Diagnosis not present

## 2017-07-29 DIAGNOSIS — S37009A Unspecified injury of unspecified kidney, initial encounter: Secondary | ICD-10-CM

## 2018-06-27 ENCOUNTER — Emergency Department: Payer: Medicare (Managed Care)

## 2018-06-27 ENCOUNTER — Inpatient Hospital Stay: Payer: Medicare (Managed Care)

## 2018-06-27 ENCOUNTER — Other Ambulatory Visit: Payer: Self-pay

## 2018-06-27 ENCOUNTER — Inpatient Hospital Stay
Admission: EM | Admit: 2018-06-27 | Discharge: 2018-06-30 | DRG: 175 | Disposition: A | Discharge status: Hospice | Payer: Medicare (Managed Care) | Attending: Internal Medicine | Admitting: Internal Medicine

## 2018-06-27 DIAGNOSIS — R0902 Hypoxemia: Secondary | ICD-10-CM | POA: Diagnosis present

## 2018-06-27 DIAGNOSIS — Z882 Allergy status to sulfonamides status: Secondary | ICD-10-CM

## 2018-06-27 DIAGNOSIS — Z8249 Family history of ischemic heart disease and other diseases of the circulatory system: Secondary | ICD-10-CM | POA: Diagnosis not present

## 2018-06-27 DIAGNOSIS — Z66 Do not resuscitate: Secondary | ICD-10-CM | POA: Diagnosis present

## 2018-06-27 DIAGNOSIS — Z79899 Other long term (current) drug therapy: Secondary | ICD-10-CM | POA: Diagnosis not present

## 2018-06-27 DIAGNOSIS — J189 Pneumonia, unspecified organism: Secondary | ICD-10-CM | POA: Diagnosis present

## 2018-06-27 DIAGNOSIS — I2699 Other pulmonary embolism without acute cor pulmonale: Secondary | ICD-10-CM | POA: Diagnosis not present

## 2018-06-27 DIAGNOSIS — E785 Hyperlipidemia, unspecified: Secondary | ICD-10-CM | POA: Diagnosis present

## 2018-06-27 DIAGNOSIS — F0390 Unspecified dementia without behavioral disturbance: Secondary | ICD-10-CM | POA: Diagnosis present

## 2018-06-27 DIAGNOSIS — I82409 Acute embolism and thrombosis of unspecified deep veins of unspecified lower extremity: Secondary | ICD-10-CM | POA: Diagnosis not present

## 2018-06-27 DIAGNOSIS — Z91048 Other nonmedicinal substance allergy status: Secondary | ICD-10-CM

## 2018-06-27 DIAGNOSIS — I82439 Acute embolism and thrombosis of unspecified popliteal vein: Secondary | ICD-10-CM | POA: Diagnosis present

## 2018-06-27 DIAGNOSIS — J811 Chronic pulmonary edema: Secondary | ICD-10-CM | POA: Diagnosis present

## 2018-06-27 DIAGNOSIS — E876 Hypokalemia: Secondary | ICD-10-CM | POA: Diagnosis present

## 2018-06-27 DIAGNOSIS — I2510 Atherosclerotic heart disease of native coronary artery without angina pectoris: Secondary | ICD-10-CM | POA: Diagnosis present

## 2018-06-27 DIAGNOSIS — I10 Essential (primary) hypertension: Secondary | ICD-10-CM | POA: Diagnosis present

## 2018-06-27 DIAGNOSIS — I2782 Chronic pulmonary embolism: Secondary | ICD-10-CM | POA: Diagnosis present

## 2018-06-27 LAB — COMPREHENSIVE METABOLIC PANEL
ALBUMIN: 3.9 g/dL (ref 3.5–5.0)
ALT: 17 U/L (ref 0–44)
AST: 26 U/L (ref 15–41)
Alkaline Phosphatase: 62 U/L (ref 38–126)
Anion gap: 11 (ref 5–15)
BUN: 20 mg/dL (ref 8–23)
CO2: 28 mmol/L (ref 22–32)
CREATININE: 0.95 mg/dL (ref 0.44–1.00)
Calcium: 9.3 mg/dL (ref 8.9–10.3)
Chloride: 99 mmol/L (ref 98–111)
GFR calc Af Amer: >60 mL/min (ref >60)
GFR calc non Af Amer: 55 mL/min — ABNORMAL LOW (ref >60)
Glucose, Bld: 229 mg/dL — ABNORMAL HIGH (ref 70–99)
Potassium: 3.7 mmol/L (ref 3.5–5.1)
Sodium: 138 mmol/L (ref 135–145)
Total Bilirubin: 0.5 mg/dL (ref 0.3–1.2)
Total Protein: 8.3 g/dL — ABNORMAL HIGH (ref 6.5–8.1)

## 2018-06-27 LAB — CBC
HCT: 38.4 % (ref 36.0–46.0)
Hemoglobin: 12.1 g/dL (ref 12.0–15.0)
MCH: 27.9 pg (ref 26.0–34.0)
MCHC: 31.5 g/dL (ref 30.0–36.0)
MCV: 88.7 fL (ref 80.0–100.0)
Platelets: 227 10*3/uL (ref 150–400)
RBC: 4.33 MIL/uL (ref 3.87–5.11)
RDW: 13.6 % (ref 11.5–15.5)
WBC: 8.8 10*3/uL (ref 4.0–10.5)
nRBC: 0 % (ref 0.0–0.2)

## 2018-06-27 LAB — APTT: aPTT: 28 seconds (ref 24–36)

## 2018-06-27 LAB — PROTIME-INR
INR: 0.95
Prothrombin Time: 12.6 seconds (ref 11.4–15.2)

## 2018-06-27 LAB — HEPARIN LEVEL (UNFRACTIONATED): Heparin Unfractionated: 0.93 IU/mL — ABNORMAL HIGH (ref 0.30–0.70)

## 2018-06-27 LAB — TROPONIN I: Troponin I: <0.03 ng/mL (ref <0.03)

## 2018-06-27 MED ORDER — ACETAMINOPHEN 650 MG RE SUPP: 650.0000 mg | Freq: Four times a day (QID) | RECTAL | Status: DC | PRN

## 2018-06-27 MED ORDER — OLMESARTAN-AMLODIPINE-HCTZ 40-10-25 MG PO TABS: 1.0000 | ORAL_TABLET | Freq: Every day | ORAL | Status: DC

## 2018-06-27 MED ORDER — IPRATROPIUM-ALBUTEROL 0.5-2.5 (3) MG/3ML IN SOLN
3.0000 mL | Freq: Once | RESPIRATORY_TRACT | Status: AC
  Administered 2018-06-27: 3 mL via RESPIRATORY_TRACT
  Filled 2018-06-27: qty 3

## 2018-06-27 MED ORDER — LABETALOL HCL 200 MG PO TABS
200.0000 mg | ORAL_TABLET | Freq: Two times a day (BID) | ORAL | Status: DC
  Administered 2018-06-27 – 2018-06-28 (×2): 200 mg via ORAL
  Filled 2018-06-27 (×3): qty 1

## 2018-06-27 MED ORDER — SENNOSIDES-DOCUSATE SODIUM 8.6-50 MG PO TABS: 1.0000 | ORAL_TABLET | Freq: Every evening | ORAL | Status: DC | PRN

## 2018-06-27 MED ORDER — MIRTAZAPINE 15 MG PO TABS
15.0000 mg | ORAL_TABLET | Freq: Every day | ORAL | Status: DC
  Administered 2018-06-27 – 2018-06-29 (×3): 15 mg via ORAL
  Filled 2018-06-27 (×3): qty 1

## 2018-06-27 MED ORDER — HALOPERIDOL LACTATE 5 MG/ML IJ SOLN
2.0000 mg | Freq: Once | INTRAMUSCULAR | Status: DC
  Filled 2018-06-27: qty 1

## 2018-06-27 MED ORDER — QUETIAPINE FUMARATE 25 MG PO TABS
50.0000 mg | ORAL_TABLET | Freq: Every day | ORAL | Status: DC
  Administered 2018-06-27 – 2018-06-29 (×3): 50 mg via ORAL
  Filled 2018-06-27 (×3): qty 2

## 2018-06-27 MED ORDER — SODIUM CHLORIDE 0.9% FLUSH: 3.0000 mL | INTRAVENOUS | Status: DC | PRN

## 2018-06-27 MED ORDER — SODIUM CHLORIDE 0.9 % IV SOLN
1.0000 g | INTRAVENOUS | Status: DC
  Administered 2018-06-28 – 2018-06-29 (×2): 1 g via INTRAVENOUS
  Filled 2018-06-27: qty 10
  Filled 2018-06-27: qty 1
  Filled 2018-06-27: qty 10

## 2018-06-27 MED ORDER — ALBUTEROL SULFATE (2.5 MG/3ML) 0.083% IN NEBU
5.0000 mg | INHALATION_SOLUTION | Freq: Once | RESPIRATORY_TRACT | Status: AC
  Administered 2018-06-27: 5 mg via RESPIRATORY_TRACT
  Filled 2018-06-27: qty 6

## 2018-06-27 MED ORDER — AMLODIPINE BESYLATE 5 MG PO TABS
5.0000 mg | ORAL_TABLET | Freq: Every day | ORAL | Status: DC
  Administered 2018-06-27 – 2018-06-30 (×4): 5 mg via ORAL
  Filled 2018-06-27 (×4): qty 1

## 2018-06-27 MED ORDER — HEPARIN (PORCINE) 25000 UT/250ML-% IV SOLN
800.0000 [IU]/h | INTRAVENOUS | Status: DC
  Administered 2018-06-27: 900 [IU]/h via INTRAVENOUS
  Filled 2018-06-27: qty 250

## 2018-06-27 MED ORDER — SODIUM CHLORIDE 0.9 % IV SOLN
1.0000 g | Freq: Once | INTRAVENOUS | Status: AC
  Administered 2018-06-27: 1 g via INTRAVENOUS
  Filled 2018-06-27: qty 10

## 2018-06-27 MED ORDER — LORAZEPAM 2 MG/ML PO CONC: 0.5000 mg | ORAL | Status: DC | PRN

## 2018-06-27 MED ORDER — IOHEXOL 350 MG/ML SOLN
75.0000 mL | Freq: Once | INTRAVENOUS | Status: AC | PRN
  Administered 2018-06-27: 75 mL via INTRAVENOUS

## 2018-06-27 MED ORDER — SODIUM CHLORIDE 0.9 % IV SOLN
250.0000 mL | INTRAVENOUS | Status: DC | PRN
  Administered 2018-06-29: 250 mL via INTRAVENOUS

## 2018-06-27 MED ORDER — SODIUM CHLORIDE 0.9 % IV SOLN
500.0000 mg | INTRAVENOUS | Status: DC
  Administered 2018-06-28 – 2018-06-29 (×2): 500 mg via INTRAVENOUS
  Filled 2018-06-27 (×3): qty 500

## 2018-06-27 MED ORDER — DONEPEZIL HCL 5 MG PO TABS
5.0000 mg | ORAL_TABLET | Freq: Every day | ORAL | Status: DC
  Administered 2018-06-27: 5 mg via ORAL
  Filled 2018-06-27 (×2): qty 1

## 2018-06-27 MED ORDER — PREGABALIN 50 MG PO CAPS
50.0000 mg | ORAL_CAPSULE | Freq: Two times a day (BID) | ORAL | Status: DC
  Administered 2018-06-27 – 2018-06-30 (×6): 50 mg via ORAL
  Filled 2018-06-27 (×6): qty 1

## 2018-06-27 MED ORDER — SODIUM CHLORIDE 0.9 % IV SOLN
500.0000 mg | Freq: Once | INTRAVENOUS | Status: AC
  Administered 2018-06-27: 500 mg via INTRAVENOUS
  Filled 2018-06-27: qty 500

## 2018-06-27 MED ORDER — ACETAMINOPHEN 325 MG PO TABS
650.0000 mg | ORAL_TABLET | Freq: Four times a day (QID) | ORAL | Status: DC | PRN
  Administered 2018-06-27: 650 mg via ORAL
  Filled 2018-06-27 (×2): qty 2

## 2018-06-27 MED ORDER — HEPARIN BOLUS VIA INFUSION
3200.0000 [IU] | Freq: Once | INTRAVENOUS | Status: AC
  Administered 2018-06-27: 3200 [IU] via INTRAVENOUS
  Filled 2018-06-27: qty 3200

## 2018-06-27 MED ORDER — SODIUM CHLORIDE 0.9% FLUSH
3.0000 mL | Freq: Two times a day (BID) | INTRAVENOUS | Status: DC
  Administered 2018-06-27 – 2018-06-30 (×6): 3 mL via INTRAVENOUS

## 2018-06-27 MED ORDER — ONDANSETRON HCL 4 MG/2ML IJ SOLN: 4.0000 mg | Freq: Four times a day (QID) | INTRAMUSCULAR | Status: DC | PRN

## 2018-06-27 MED ORDER — ONDANSETRON HCL 4 MG PO TABS: 4.0000 mg | ORAL_TABLET | Freq: Four times a day (QID) | ORAL | Status: DC | PRN

## 2018-06-27 MED ORDER — TRAZODONE HCL 50 MG PO TABS: 50.0000 mg | ORAL_TABLET | Freq: Every evening | ORAL | Status: DC | PRN

## 2018-06-27 MED ORDER — TRAZODONE HCL 50 MG PO TABS: 50.0000 mg | ORAL_TABLET | Freq: Every day | ORAL | Status: DC

## 2018-06-27 NOTE — ED Notes (Signed)
Heparin drip verified by Janeann Forehand

## 2018-06-27 NOTE — ED Notes (Signed)
Pt returned from CT and tolerated it well. No sedation was necessary. Will withhold haldol at this time. Dr Lenard Lance notified.

## 2018-06-27 NOTE — ED Provider Notes (Signed)
[facility] Emergency Department Provider Note  Time seen: 11:54 AM  I have reviewed the triage vital signs and the nursing notes.   HISTORY  Chief Complaint Respiratory Distress    HPI Christine Lin is a 85 y.o. female with a past medical history of hypertension, dementia, hyperlipidemia, presents to the emergency department for difficulty breathing.  According to pace report, daughter called for increased difficulty breathing over the past 24 hours with cough.  Pace staff found the patient to have a room air saturation of 79%, with bilateral rhonchi.  Give the patient a DuoNeb and placed on oxygen therapy while awaiting EMS arrival.  EMS states upon arrival they were able to maintain sats in the low 90s on supplemental oxygen through nasal cannula.  Here the patient is awake alert, no distress, lying in bed calm and cooperative.  Has baseline dementia and cannot provide history or review of systems.   Past Medical History:  Diagnosis Date  . Abnormal weight loss   . Allergy   . ASCVD (arteriosclerotic cardiovascular disease)   . Diverticulosis   . Glaucoma suspect   . Hand paresthesia   . Heart murmur   . History of herpes zoster   . Hypertension   . Memory loss   . Prediabetes   . Tinnitus of both ears     Patient Active Problem List   Diagnosis Date Noted  . Cardiovascular disease 12/18/2014  . Diverticulosis of colon 12/18/2014  . Acid reflux 12/18/2014  . Benign essential HTN 12/18/2014  . Glaucoma suspect 12/18/2014  . Dementia (HCC) 12/18/2014  . Hand paresthesia 12/18/2014  . Cardiac murmur 12/18/2014  . History of shingles 12/18/2014  . HLD (hyperlipidemia) 12/18/2014  . Leukocytopenia 12/18/2014  . Borderline diabetes 12/18/2014  . Allergic rhinitis 12/18/2014  . Bilateral tinnitus 12/18/2014  . Decreased body weight 04/12/2008    No past surgical history on file.  Prior to Admission medications   Medication Sig Start Date End  Date Taking? Authorizing Provider  Cholecalciferol (VITAMIN D) 2000 UNITS CAPS Take by mouth. 03/23/11   [provider]  diclofenac sodium (VOLTAREN) 1 % GEL Apply 4 g topically 4 (four) times daily. 12/31/14   Alba CorySowles, Krichna, MD  donepezil (ARICEPT) 5 MG tablet TAKE 1 TABLET BY MOUTH EVERY NIGHT AT BEDTIME 12/23/14   Alba CorySowles, Krichna, MD  labetalol (NORMODYNE) 200 MG tablet Take 1 tablet (200 mg total) by mouth 2 (two) times daily. 12/19/14   Alba CorySowles, Krichna, MD  loratadine (CLARITIN) 10 MG tablet Take by mouth. 08/25/10   [provider]  mirtazapine (REMERON) 15 MG tablet Take by mouth. 07/06/13   [provider]  nitrofurantoin, macrocrystal-monohydrate, (MACROBID) 100 MG capsule Take 1 capsule (100 mg total) by mouth 2 (two) times daily. 06/06/15   Irean HongSung, Jade J, MD  Olmesartan-Amlodipine-HCTZ [facility](TRIBENZOR) 40-10-25 MG TABS Take 1 tablet by mouth daily. 12/19/14   Alba CorySowles, Krichna, MD  potassium chloride 20 MEQ TBCR Take 20 mEq 2 (two) times daily for 14 days by mouth. 04/24/17 05/08/17  Dionne BucySiadecki, Sebastian, MD    Allergies  Allergen Reactions  . Sulfa Antibiotics     Family History  Problem Relation Age of Onset  . Cancer Mother   . Cancer Sister        Brain  . Heart disease Brother   . Diabetes Daughter   . Hypertension Daughter     Social History Social History   Tobacco Use  . Smoking status: Never Smoker  .  Smokeless tobacco: Never Used  Substance Use Topics  . Alcohol use: No    Alcohol/week: 0.0 standard drinks  . Drug use: No    Review of Systems Unable to obtain adequate/accurate review of systems secondary to baseline dementia   ____________________________________________   PHYSICAL EXAM:  VITAL SIGNS: ED Triage Vitals  Enc Vitals Group     BP 06/27/18 1145 128/75     Pulse Rate 06/27/18 1145 82     Resp --      Temp 06/27/18 1145 (!) 97.4 F (36.3 C)     Temp Source 06/27/18 1145 Oral     SpO2 06/27/18 1145 95 %     Weight  06/27/18 1143 140 lb (63.5 kg)     Height 06/27/18 1143 5\' 5"  (1.651 m)     Head Circumference --      Peak Flow --      Pain Score 06/27/18 1142 0     Pain Loc --      Pain Edu? --      Excl. in GC? --    Constitutional: Alert, awake, no distress, overall well-appearing Eyes: Normal exam ENT   Head: Normocephalic and atraumatic.   Mouth/Throat: Mucous membranes are moist. Cardiovascular: Normal rate, regular rhythm Respiratory: Normal respiratory effort without tachypnea nor retractions. Breath sounds are clear  Gastrointestinal: Soft and nontender. No distention.   Musculoskeletal: Nontender with normal range of motion in all extremities.  Neurologic:  Normal speech and language. No gross focal neurologic deficits  Skin:  Skin is warm, dry and intact.  Psychiatric: Mood and affect are normal.  ____________________________________________    EKG  EKG viewed and interpreted by myself shows sinus rhythm versus A. fib at 82 bpm with a narrow QRS, normal axis, normal intervals, no concerning ST changes.  ____________________________________________    RADIOLOGY  Chest x-ray is negative.  CT angiography shows left-sided pulmonary embolism with right-sided opacities/consolidation.  ____________________________________________   INITIAL IMPRESSION / ASSESSMENT AND PLAN / ED COURSE  Pertinent labs & imaging results that were available during my care of the patient were reviewed by me and considered in my medical decision making (see chart for details).  Patient presents to the emergency department for respiratory distress found to be hypoxic to 79% on room air per pace physician.  Upon arrival here patient is maintaining sats of 92% on 2 L nasal cannula.  Does appear to have mild rhonchi and occasional cough during my examination.  Differential this time would include pneumonia, influenza, reactive airway disease/COPD, ACS.  We will check labs, chest x-ray, influenza  swab.  We will continue to closely monitor while awaiting results.  Patient's x-ray is negative however given the patient's acute onset of hypoxia we will proceed with CT imaging to further evaluate.  Patient CT scan is resulted positive for left-sided pulmonary embolism as well as right-sided pneumonia.  We will check blood cultures, start on antibiotics.  We will start on a heparin infusion and admit to the hospitalist service.  Patient and family agreeable to plan of care  CRITICAL CARE Performed by: Minna Antis   Total critical care time: 30 minutes  Critical care time was exclusive of separately billable procedures and treating other patients.  Critical care was necessary to treat or prevent imminent or life-threatening deterioration.  Critical care was time spent personally by me on the following activities: development of treatment plan with patient and/or surrogate as well as nursing, discussions with consultants, evaluation of patient's response  to treatment, examination of patient, obtaining history from patient or surrogate, ordering and performing treatments and interventions, ordering and review of laboratory studies, ordering and review of radiographic studies, pulse oximetry and re-evaluation of patient's condition.   ____________________________________________   FINAL CLINICAL IMPRESSION(S) / ED DIAGNOSES  Hypoxia Dyspnea Pulmonary embolism Pneumonia   Minna AntisPaduchowski, Nichole Keltner, MD 06/27/18 1422

## 2018-06-27 NOTE — Progress Notes (Signed)
Advanced care plan.  Purpose of the Encounter: CODE STATUS  Parties in Attendance: Patient and family  Patient's Decision Capacity: Good  Subjective/Patient's story: Patient was referred by his physician program for shortness of breath and cough   Objective/Medical story She was evaluated in the emergency room she has pneumonia and CTA chest showed pulmonary embolism Patient needs IV heparin drip for anticoagulation  Goals of care determination:  Advance care directives goals of care and treatment plan discussed Patient and family do not want CPR, intubation ventilator if the need arises  CODE STATUS: DNR   Time spent discussing advanced care planning: 16 minutes

## 2018-06-27 NOTE — Consult Note (Signed)
ANTICOAGULATION CONSULT NOTE - Initial Consult  Pharmacy Consult for Heparin Infusion Indication: pulmonary embolus  Allergies  Allergen Reactions  . Sulfa Antibiotics     Patient Measurements: Height: 5\' 5"  (165.1 cm) Weight: 140 lb (63.5 kg) IBW/kg (Calculated) : 57 Heparin Dosing Weight: 63.5  Vital Signs: Temp: 100.5 F (38.1 C) (01/14 1936) Temp Source: Oral (01/14 1936) BP: 144/79 (01/14 2150) Pulse Rate: 73 (01/14 2150)  Labs: Recent Labs    06/27/18 1153 06/27/18 2257  HGB 12.1  --   HCT 38.4  --   PLT 227  --   APTT 28  --   LABPROT 12.6  --   INR 0.95  --   HEPARINUNFRC  --  0.93*  CREATININE 0.95  --   TROPONINI <0.03  --     Estimated Creatinine Clearance: 39.7 mL/min (by C-G formula based on SCr of 0.95 mg/dL).   Medical History: Past Medical History:  Diagnosis Date  . Abnormal weight loss   . Allergy   . ASCVD (arteriosclerotic cardiovascular disease)   . Diverticulosis   . Glaucoma suspect   . Hand paresthesia   . Heart murmur   . History of herpes zoster   . Hypertension   . Memory loss   . Prediabetes   . Tinnitus of both ears     Medications:    Assessment: 85 y.o. female with a past medical history of hypertension, dementia, hyperlipidemia, presents to the emergency department for difficulty breathing.  According to pace report, daughter called for increased difficulty breathing over the past 24 hours with cough.  Patient CT scan is resulted positive for left-sided pulmonary embolism as well as right-sided pneumonia.  No notation of prior anticoagulation.  Goal of Therapy:  Heparin level 0.3-0.7 units/ml Monitor platelets by anticoagulation protocol: Yes   Plan:  01/14 @ 2300 HL 0.93 supratherapeutic. Will decrease rate to 800 units/hr and will recheck HL @ 0800. Will f/u w/ CBC w/ am labs.  Thomasene Ripple, PharmD Clinical Pharmacist 06/27/2018,11:43 PM

## 2018-06-27 NOTE — ED Notes (Signed)
Patient transported to Ultrasound 

## 2018-06-27 NOTE — ED Notes (Signed)
Pt departed to CT before haldol could be given. CT notified to call if pt unable to stay still for imaging study.

## 2018-06-27 NOTE — H&P (Signed)
[facility] [place] at [facility]   PATIENT NAME: Christine GamblesHelen Lin    MR#:  [id number]  DATE OF BIRTH:  1932-04-25  DATE OF ADMISSION:  06/27/2018  PRIMARY CARE PHYSICIAN: [facility]   REQUESTING/REFERRING PHYSICIAN:   CHIEF COMPLAINT:   Chief Complaint  Patient presents with  . Respiratory Distress    HISTORY OF PRESENT ILLNESS: Christine Lin  is a 85 y.o. female with a known history of diverticulosis of colon, hypertension, herpes zoster in the past was found to be in respiratory distress by [facility] physician program personnel when they visited the patient's home.  She was referred to the emergency room.  Patient was short of breath and saturation was low.  She had to be put on oxygen via nasal cannula.  During the work-up in the emergency room with a CTA chest showed pulmonary bruising from with pneumonia.  Patient was started on IV heparin drip for anticoagulation.  Patient was also started on IV Rocephin and Zithromax antibiotics.  Patient has cough productive of phlegm.  PAST MEDICAL HISTORY:   Past Medical History:  Diagnosis Date  . Abnormal weight loss   . Allergy   . ASCVD (arteriosclerotic cardiovascular disease)   . Diverticulosis   . Glaucoma suspect   . Hand paresthesia   . Heart murmur   . History of herpes zoster   . Hypertension   . Memory loss   . Prediabetes   . Tinnitus of both ears     PAST SURGICAL HISTORY: None  SOCIAL HISTORY:  Social History   Tobacco Use  . Smoking status: Never Smoker  . Smokeless tobacco: Never Used  Substance Use Topics  . Alcohol use: No    Alcohol/week: 0.0 standard drinks  No history of illicit drug use  FAMILY HISTORY:  Family History  Problem Relation Age of Onset  . Cancer Sister        Brain  . Heart disease Brother   . Diabetes Daughter   . Hypertension Daughter   . Cancer Mother     DRUG ALLERGIES:  Allergies  Allergen Reactions  . Sulfa Antibiotics      REVIEW OF SYSTEMS:   CONSTITUTIONAL: No fever, has fatigue and weakness.  EYES: No blurred or double vision.  EARS, NOSE, AND THROAT: No tinnitus or ear pain.  RESPIRATORY: Has cough, shortness of breath, wheezing  no hemoptysis.  CARDIOVASCULAR: No chest pain, orthopnea, edema.  GASTROINTESTINAL: No nausea, vomiting, diarrhea or abdominal pain.  GENITOURINARY: No dysuria, hematuria.  ENDOCRINE: No polyuria, nocturia,  HEMATOLOGY: No anemia, easy bruising or bleeding SKIN: No rash or lesion. MUSCULOSKELETAL: No joint pain or arthritis.   NEUROLOGIC: No tingling, numbness, weakness.  PSYCHIATRY: No anxiety or depression.   MEDICATIONS AT HOME:  Prior to Admission medications   Medication Sig Start Date End Date Taking? Authorizing Provider  donepezil (ARICEPT) 5 MG tablet TAKE 1 TABLET BY MOUTH EVERY NIGHT AT BEDTIME 12/23/14  Yes Sowles, Danna HeftyKrichna, MD  labetalol (NORMODYNE) 200 MG tablet Take 1 tablet (200 mg total) by mouth 2 (two) times daily. 12/19/14  Yes Sowles, Danna HeftyKrichna, MD  Olmesartan-Amlodipine-HCTZ [facility](TRIBENZOR) 40-10-25 MG TABS Take 1 tablet by mouth daily. 12/19/14  Yes Sowles, Danna HeftyKrichna, MD  mirtazapine (REMERON) 15 MG tablet Take 15 mg by mouth at bedtime.  07/06/13   [provider]      PHYSICAL EXAMINATION:   VITAL SIGNS: Blood pressure (!) 173/117, pulse 63, temperature (!) 97.4 F (36.3 C), temperature  source Oral, resp. rate (!) 25, height 5\' 5"  (1.651 m), weight 63.5 kg, SpO2 97 %.  GENERAL:  85 y.o.-year-old patient lying in the bed on oxygen via nasal cannula EYES: Pupils equal, round, reactive to light and accommodation. No scleral icterus. Extraocular muscles intact.  HEENT: Head atraumatic, normocephalic. Oropharynx and nasopharynx clear.  NECK:  Supple, no jugular venous distention. No thyroid enlargement, no tenderness.  LUNGS: Decreased breath sounds bilaterally, Rales heard in right lung. No use of accessory muscles of respiration.  CARDIOVASCULAR:  S1, S2 normal. No murmurs, rubs, or gallops.  ABDOMEN: Soft, nontender, nondistended. Bowel sounds present. No organomegaly or mass.  EXTREMITIES: No pedal edema, cyanosis, or clubbing.  NEUROLOGIC: Cranial nerves II through XII are intact. Muscle strength 5/5 in all extremities. Sensation intact. Gait not checked.  PSYCHIATRIC: The patient is alert and oriented x 3.  SKIN: No obvious rash, lesion, or ulcer.   LABORATORY PANEL:   CBC Recent Labs  Lab 06/27/18 1153  WBC 8.8  HGB 12.1  HCT 38.4  PLT 227  MCV 88.7  MCH 27.9  MCHC 31.5  RDW 13.6   ------------------------------------------------------------------------------------------------------------------  Chemistries  Recent Labs  Lab 06/27/18 1153  NA 138  K 3.7  CL 99  CO2 28  GLUCOSE 229*  BUN 20  CREATININE 0.95  CALCIUM 9.3  AST 26  ALT 17  ALKPHOS 62  BILITOT 0.5   ------------------------------------------------------------------------------------------------------------------ estimated creatinine clearance is 39.7 mL/min (by C-G formula based on SCr of 0.95 mg/dL). ------------------------------------------------------------------------------------------------------------------ No results for input(s): TSH, T4TOTAL, T3FREE, THYROIDAB in the last 72 hours.  Invalid input(s): FREET3   Coagulation profile No results for input(s): INR, PROTIME in the last 168 hours. ------------------------------------------------------------------------------------------------------------------- No results for input(s): DDIMER in the last 72 hours. -------------------------------------------------------------------------------------------------------------------  Cardiac Enzymes Recent Labs  Lab 06/27/18 1153  TROPONINI <0.03   ------------------------------------------------------------------------------------------------------------------ Invalid input(s):  POCBNP  ---------------------------------------------------------------------------------------------------------------  Urinalysis    Component Value Date/Time   COLORURINE YELLOW (A) 04/23/2017 2206   APPEARANCEUR HAZY (A) 04/23/2017 2206   LABSPEC 1.010 04/23/2017 2206   PHURINE 7.0 04/23/2017 2206   GLUCOSEU NEGATIVE 04/23/2017 2206   HGBUR SMALL (A) 04/23/2017 2206   BILIRUBINUR NEGATIVE 04/23/2017 2206   KETONESUR NEGATIVE 04/23/2017 2206   PROTEINUR 30 (A) 04/23/2017 2206   NITRITE NEGATIVE 04/23/2017 2206   LEUKOCYTESUR NEGATIVE 04/23/2017 2206     RADIOLOGY: Dg Chest 2 View  Result Date: 06/27/2018 CLINICAL DATA:  Acute shortness of breath. EXAM: CHEST - 2 VIEW COMPARISON:  04/23/2017. FINDINGS: AP ERECT and LATERAL images were obtained. Stable marked cardiomegaly. Thoracic aorta tortuous and atherosclerotic. Hilar and mediastinal contours otherwise unremarkable. Stable chronic elevation of the RIGHT hemidiaphragm with chronic scar/atelectasis involving the RIGHT LOWER LOBE. Lungs otherwise clear. No localized airspace consolidation. No pleural effusions. No pneumothorax. Normal pulmonary vascularity. Degenerative changes involving the thoracic and UPPER lumbar spine. IMPRESSION: Stable marked cardiomegaly. No acute cardiopulmonary disease. Stable chronic elevation of the RIGHT hemidiaphragm with chronic scar/atelectasis involving the RIGHT LOWER LOBE. Electronically Signed   By: Hulan Saashomas  Lawrence M.D.   On: 06/27/2018 12:15   Ct Angio Chest Pe W And/or Wo Contrast  Result Date: 06/27/2018 CLINICAL DATA:  Shortness of breath. Confusion. Decreased oxygen saturation. EXAM: CT ANGIOGRAPHY CHEST WITH CONTRAST TECHNIQUE: Multidetector CT imaging of the chest was performed using the standard protocol during bolus administration of intravenous contrast. Multiplanar CT image reconstructions and MIPs were obtained to evaluate the vascular anatomy. CONTRAST:  75mL OMNIPAQUE IOHEXOL 350  MG/ML SOLN  COMPARISON:  Chest radiograph 06/27/2018 FINDINGS: Cardiovascular: The heart is enlarged. Ascending thoracic aorta measures 3.7 cm. Thoracic aortic vascular calcifications. Small amount of fluid superior pericardial recess. Adequate opacification of the pulmonary arterial system. Marked motion artifact limits evaluation. Peripheral nonocclusive thrombus is demonstrated within the proximal left lower lobe pulmonary artery (image 117; series 5) extending into the left lower lobe segmental pulmonary arteries. Mediastinum/Nodes: No enlarged axillary, mediastinal or hilar lymphadenopathy. Lungs/Pleura: Central airways are patent. Patchy consolidative opacities within the right middle, right lower left lower lobes. No pleural effusion or pneumothorax. Upper Abdomen: No acute process. Musculoskeletal: Thoracic spine degenerative changes. No aggressive or acute appearing osseous lesions. Review of the MIP images confirms the above findings. IMPRESSION: 1. Pulmonary embolus demonstrated within the proximal left lower lobe pulmonary artery extending into the left lower lobe segmental and subsegmental pulmonary arteries. Given the peripheral nature, a component of this may be chronic in etiology. 2. Patchy consolidative opacities within the right middle and right lower lobes may represent atelectasis or infection. 3. Exam limited secondary to motion artifact. 4. Critical Value/emergent results were called by telephone at the time of interpretation on 06/27/2018 at 2:10 pm to Dr. Minna Antis , who verbally acknowledged these results. Electronically Signed   By: Annia Belt M.D.   On: 06/27/2018 14:16    EKG: Orders placed or performed during the hospital encounter of 06/27/18  . ED EKG  . ED EKG  . EKG 12-Lead  . EKG 12-Lead  . EKG 12-Lead  . EKG 12-Lead  . EKG 12-Lead  . EKG 12-Lead    IMPRESSION AND PLAN:  85 year old early female patient with history of herpes zoster, diverticulosis of colon,  atherosclerotic heart disease presented to the emergency room for shortness of breath and cough  -Acute pulmonary embolism Admit patient to medical floor IV heparin drip for anticoagulation Check Doppler ultrasound of legs for DVT  -Community-acquired pneumonia Start patient on IV Rocephin and Zithromax antibiotics Follow-up cultures  -Acute respiratory distress with hypoxia Secondary to pulmonary edema and pneumonia Oxygen via nasal cannula  -DVT prophylaxis On full dose anticoagulation with heparin  All the records are reviewed and case discussed with ED provider. Management plans discussed with the patient, family and they are in agreement.  CODE STATUS:DNR    Code Status Orders  (From admission, onward)         Start     Ordered   06/27/18 1446  Do not attempt resuscitation (DNR)  Continuous    Question Answer Comment  In the event of cardiac or respiratory ARREST Do not call a "code blue"   In the event of cardiac or respiratory ARREST Do not perform Intubation, CPR, defibrillation or ACLS   In the event of cardiac or respiratory ARREST Use medication by any route, position, wound care, and other measures to relive pain and suffering. May use oxygen, suction and manual treatment of airway obstruction as needed for comfort.      06/27/18 1445        Code Status History    This patient has a current code status but no historical code status.    Advance Directive Documentation     Most Recent Value  Type of Advance Directive  Out of facility DNR (pink MOST or yellow form)  Pre-existing out of facility DNR order (yellow form or pink MOST form)  -  "MOST" Form in Place?  -       TOTAL TIME TAKING CARE OF THIS  PATIENT: 53 minutes.    Ihor Austin M.D on 06/27/2018 at 2:47 PM  Between 7am to 6pm - Pager - [phone]  After 6pm go to [email or web address] - password [email or web address] [facility]  [place] [facility]  Office  [phone]  CC: Primary care physician;  Center, [facility]

## 2018-06-27 NOTE — ED Notes (Signed)
Admitting MD at bedside.

## 2018-06-27 NOTE — Consult Note (Signed)
ANTICOAGULATION CONSULT NOTE - Initial Consult  Pharmacy Consult for Heparin Infusion Indication: pulmonary embolus  Allergies  Allergen Reactions  . Sulfa Antibiotics     Patient Measurements: Height: 5\' 5"  (165.1 cm) Weight: 140 lb (63.5 kg) IBW/kg (Calculated) : 57 Heparin Dosing Weight: 63.5  Vital Signs: Temp: 97.4 F (36.3 C) (01/14 1145) Temp Source: Oral (01/14 1145) BP: 173/117 (01/14 1400) Pulse Rate: 63 (01/14 1400)  Labs: Recent Labs    06/27/18 1153  HGB 12.1  HCT 38.4  PLT 227  CREATININE 0.95  TROPONINI <0.03    Estimated Creatinine Clearance: 39.7 mL/min (by C-G formula based on SCr of 0.95 mg/dL).   Medical History: Past Medical History:  Diagnosis Date  . Abnormal weight loss   . Allergy   . ASCVD (arteriosclerotic cardiovascular disease)   . Diverticulosis   . Glaucoma suspect   . Hand paresthesia   . Heart murmur   . History of herpes zoster   . Hypertension   . Memory loss   . Prediabetes   . Tinnitus of both ears     Medications:    Assessment: 85 y.o. female with a past medical history of hypertension, dementia, hyperlipidemia, presents to the emergency department for difficulty breathing.  According to [facility] report, daughter called for increased difficulty breathing over the past 24 hours with cough.  Patient CT scan is resulted positive for left-sided pulmonary embolism as well as right-sided pneumonia.  No notation of prior anticoagulation.  Goal of Therapy:  Heparin level 0.3-0.7 units/ml Monitor platelets by anticoagulation protocol: Yes   Plan:  Give 3200 units bolus x 1 Start heparin infusion at 900 units/hr Check heparin level every 8 hours until two consecutive therapeutic levels then daily and CBC daily while on heparin Continue to monitor H&H and platelets  Orinda Kenner, PharmD Clinical Pharmacist 06/27/2018,2:37 PM

## 2018-06-27 NOTE — ED Notes (Signed)
ED TO INPATIENT HANDOFF REPORT  Name/Age/Gender Christine LemmingHelen L Lin 85 y.o. female  Code Status    Code Status Orders  (From admission, onward)         Start     Ordered   06/27/18 1446  Do not attempt resuscitation (DNR)  Continuous    Question Answer Comment  In the event of cardiac or respiratory ARREST Do not call a "code blue"   In the event of cardiac or respiratory ARREST Do not perform Intubation, CPR, defibrillation or ACLS   In the event of cardiac or respiratory ARREST Use medication by any route, position, wound care, and other measures to relive pain and suffering. May use oxygen, suction and manual treatment of airway obstruction as needed for comfort.      06/27/18 1445        Code Status History    This patient has a current code status but no historical code status.    Advance Directive Documentation     Most Recent Value  Type of Advance Directive  Out of facility DNR (pink MOST or yellow form)  Pre-existing out of facility DNR order (yellow form or pink MOST form)  -  "MOST" Form in Place?  -      Home/SNF/Other Home  Chief Complaint sob  Level of Care/Admitting Diagnosis ED Disposition    ED Disposition Condition Comment   Admit  Hospital [facility] [[id number]]  Level of Care: Med-Surg [16]  Diagnosis: Pulmonary embolism [facility](HCC) [[id number]]  Admitting Physician: Ihor AustinPYREDDY, PAVAN [[id number]]  Attending Physician: Ihor AustinPYREDDY, PAVAN [[id number]]  Estimated length of stay: past midnight tomorrow  Certification:: I certify this patient will need inpatient services for at least 2 midnights  PT Class (Do Not Modify): Inpatient [101]  PT Acc Code (Do Not Modify): Private [1]       Medical History Past Medical History:  Diagnosis Date  . Abnormal weight loss   . Allergy   . ASCVD (arteriosclerotic cardiovascular disease)   . Diverticulosis   . Glaucoma suspect   . Hand paresthesia   . Heart murmur   . History of herpes zoster   .  Hypertension   . Memory loss   . Prediabetes   . Tinnitus of both ears     Allergies Allergies  Allergen Reactions  . Sulfa Antibiotics     IV Location/Drains/Wounds Patient Lines/Drains/Airways Status   Active Line/Drains/Airways    Name:   Placement date:   Placement time:   Site:   Days:   Peripheral IV 06/27/18 Right Antecubital   06/27/18    1146    Antecubital   less than 1   Peripheral IV 06/27/18 Left Hand   06/27/18    1436    Hand   less than 1          Labs/Imaging Results for orders placed or performed during the hospital encounter of 06/27/18 (from the past 48 hour(s))  CBC     Status: None   Collection Time: 06/27/18 11:53 AM  Result Value Ref Range   WBC 8.8 4.0 - 10.5 K/uL   RBC 4.33 3.87 - 5.11 MIL/uL   Hemoglobin 12.1 12.0 - 15.0 g/dL   HCT 56.238.4 13.036.0 - 86.546.0 %   MCV 88.7 80.0 - 100.0 fL   MCH 27.9 26.0 - 34.0 pg   MCHC 31.5 30.0 - 36.0 g/dL   RDW 78.413.6 69.611.5 - 29.515.5 %   Platelets 227 150 - 400 K/uL  nRBC 0.0 0.0 - 0.2 %    Comment: Performed at [facility], [address] [place], [place] [address]  Comprehensive metabolic panel     Status: Abnormal   Collection Time: 06/27/18 11:53 AM  Result Value Ref Range   Sodium 138 135 - 145 mmol/L   Potassium 3.7 3.5 - 5.1 mmol/L   Chloride 99 98 - 111 mmol/L   CO2 28 22 - 32 mmol/L   Glucose, Bld 229 (H) 70 - 99 mg/dL   BUN 20 8 - 23 mg/dL   Creatinine, Ser 9.38 0.44 - 1.00 mg/dL   Calcium 9.3 8.9 - 10.1 mg/dL   Total Protein 8.3 (H) 6.5 - 8.1 g/dL   Albumin 3.9 3.5 - 5.0 g/dL   AST 26 15 - 41 U/L   ALT 17 0 - 44 U/L   Alkaline Phosphatase 62 38 - 126 U/L   Total Bilirubin 0.5 0.3 - 1.2 mg/dL   GFR calc non Af Amer 55 (L) >60 mL/min   GFR calc Af Amer >60 >60 mL/min   Anion gap 11 5 - 15    Comment: Performed at [facility], [address] [place], [place] [address]  Troponin I - ONCE - STAT     Status: None   Collection Time: 06/27/18 11:53 AM  Result Value Ref Range    Troponin I <0.03 <0.03 ng/mL    Comment: Performed at [facility], [address] [place], [place] [address]  Protime-INR     Status: None   Collection Time: 06/27/18 11:53 AM  Result Value Ref Range   Prothrombin Time 12.6 11.4 - 15.2 seconds   INR 0.95     Comment: Performed at [facility], [address] [place], [place] [address]  APTT     Status: None   Collection Time: 06/27/18 11:53 AM  Result Value Ref Range   aPTT 28 24 - 36 seconds    Comment: Performed at [facility], [address] [place], [place] [address]   Dg Chest 2 View  Result Date: 06/27/2018 CLINICAL DATA:  Acute shortness of breath. EXAM: CHEST - 2 VIEW COMPARISON:  04/23/2017. FINDINGS: AP ERECT and LATERAL images were obtained. Stable marked cardiomegaly. Thoracic aorta tortuous and atherosclerotic. Hilar and mediastinal contours otherwise unremarkable. Stable chronic elevation of the RIGHT hemidiaphragm with chronic scar/atelectasis involving the RIGHT LOWER LOBE. Lungs otherwise clear. No localized airspace consolidation. No pleural effusions. No pneumothorax. Normal pulmonary vascularity. Degenerative changes involving the thoracic and UPPER lumbar spine. IMPRESSION: Stable marked cardiomegaly. No acute cardiopulmonary disease. Stable chronic elevation of the RIGHT hemidiaphragm with chronic scar/atelectasis involving the RIGHT LOWER LOBE. Electronically Signed   By: Hulan Saas M.D.   On: 06/27/2018 12:15   Ct Angio Chest Pe W And/or Wo Contrast  Result Date: 06/27/2018 CLINICAL DATA:  Shortness of breath. Confusion. Decreased oxygen saturation. EXAM: CT ANGIOGRAPHY CHEST WITH CONTRAST TECHNIQUE: Multidetector CT imaging of the chest was performed using the standard protocol during bolus administration of intravenous contrast. Multiplanar CT image reconstructions and MIPs were obtained to evaluate the vascular anatomy. CONTRAST:  54mL OMNIPAQUE IOHEXOL 350 MG/ML SOLN COMPARISON:   Chest radiograph 06/27/2018 FINDINGS: Cardiovascular: The heart is enlarged. Ascending thoracic aorta measures 3.7 cm. Thoracic aortic vascular calcifications. Small amount of fluid superior pericardial recess. Adequate opacification of the pulmonary arterial system. Marked motion artifact limits evaluation. Peripheral nonocclusive thrombus is demonstrated within the proximal left lower lobe pulmonary artery (image 117; series  5) extending into the left lower lobe segmental pulmonary arteries. Mediastinum/Nodes: No enlarged axillary, mediastinal or hilar lymphadenopathy. Lungs/Pleura: Central airways are patent. Patchy consolidative opacities within the right middle, right lower left lower lobes. No pleural effusion or pneumothorax. Upper Abdomen: No acute process. Musculoskeletal: Thoracic spine degenerative changes. No aggressive or acute appearing osseous lesions. Review of the MIP images confirms the above findings. IMPRESSION: 1. Pulmonary embolus demonstrated within the proximal left lower lobe pulmonary artery extending into the left lower lobe segmental and subsegmental pulmonary arteries. Given the peripheral nature, a component of this may be chronic in etiology. 2. Patchy consolidative opacities within the right middle and right lower lobes may represent atelectasis or infection. 3. Exam limited secondary to motion artifact. 4. Critical Value/emergent results were called by telephone at the time of interpretation on 06/27/2018 at 2:10 pm to Dr. Minna Antis , who verbally acknowledged these results. Electronically Signed   By: Annia Belt M.D.   On: 06/27/2018 14:16   US Venous Img Lower Bilateral  Result Date: 06/27/2018 CLINICAL DATA:  Acute pulmonary embolus, leg pain and edema EXAM: BILATERAL LOWER EXTREMITY VENOUS DOPPLER ULTRASOUND TECHNIQUE: Gray-scale sonography with graded compression, as well as color Doppler and duplex ultrasound were performed to evaluate the lower extremity deep  venous systems from the level of the common femoral vein and including the common femoral, femoral, profunda femoral, popliteal and calf veins including the posterior tibial, peroneal and gastrocnemius veins when visible. The superficial great saphenous vein was also interrogated. Spectral Doppler was utilized to evaluate flow at rest and with distal augmentation maneuvers in the common femoral, femoral and popliteal veins. COMPARISON:  None. FINDINGS: RIGHT LOWER EXTREMITY Common Femoral Vein: No evidence of thrombus. Normal compressibility, respiratory phasicity and response to augmentation. Saphenofemoral Junction: No evidence of thrombus. Normal compressibility and flow on color Doppler imaging. Profunda Femoral Vein: No evidence of thrombus. Normal compressibility and flow on color Doppler imaging. Femoral Vein: No evidence of thrombus. Normal compressibility, respiratory phasicity and response to augmentation. Popliteal Vein: No evidence of thrombus. Normal compressibility, respiratory phasicity and response to augmentation. Calf Veins: Posterior tibial vein appears patent and compressible. Peroneal vein not visualized. Superficial Great Saphenous Vein: No evidence of thrombus. Normal compressibility. Venous Reflux:  None. Other Findings:  None. LEFT LOWER EXTREMITY Common Femoral Vein: Hypoechoic intraluminal thrombus appearing nonocclusive. Vessel is noncompressible. Saphenofemoral Junction: No evidence of thrombus. Normal compressibility and flow on color Doppler imaging. Profunda Femoral Vein: No evidence of thrombus. Normal compressibility and flow on color Doppler imaging. Femoral Vein: Hypoechoic thrombus. Vessel is partially compressible. Thrombus is nonocclusive throughout the femoral vein Popliteal Vein: Residual hypoechoic thrombus appearing partially compressible and nonocclusive. Calf Veins: Popliteal thrombus does extend into the posterior tibial veins appearing nonocclusive. Superficial Great  Saphenous Vein: No evidence of thrombus. Normal compressibility. Venous Reflux:  None. Other Findings:  None. IMPRESSION: Positive exam for residual left common femoral, femoral, popliteal and tibial nonocclusive DVT. No significant right lower extremity DVT. Electronically Signed   By: Judie Petit.  Shick M.D.   On: 06/27/2018 16:04    Pending Labs Unresulted Labs (From admission, onward)    Start     Ordered   06/28/18 0500  Basic metabolic panel  Tomorrow morning,   STAT     06/27/18 1809   06/28/18 0500  CBC  Tomorrow morning,   STAT     06/27/18 1809   06/27/18 2300  Heparin level (unfractionated)  Once-Timed,   STAT  06/27/18 1444   06/27/18 1421  Blood culture (routine x 2)  BLOOD CULTURE X 2,   STAT     06/27/18 1421          Vitals/Pain Today's Vitals   06/27/18 1143 06/27/18 1145 06/27/18 1302 06/27/18 1400  BP:  128/75  (!) 173/117  Pulse:  82  63  Resp:    (!) 25  Temp:  (!) 97.4 F (36.3 C)    TempSrc:  Oral    SpO2:  95%  97%  Weight: 63.5 kg     Height: 5\' 5"  (1.651 m)     PainSc:   0-No pain     Isolation Precautions No active isolations  Medications Medications  labetalol (NORMODYNE) tablet 200 mg (has no administration in time range)  Olmesartan-amLODIPine-HCTZ 40-10-25 MG TABS 1 tablet (has no administration in time range)  donepezil (ARICEPT) tablet 5 mg (has no administration in time range)  mirtazapine (REMERON) tablet 15 mg (has no administration in time range)  sodium chloride flush (NS) 0.9 % injection 3 mL (has no administration in time range)  sodium chloride flush (NS) 0.9 % injection 3 mL (has no administration in time range)  0.9 %  sodium chloride infusion (has no administration in time range)  acetaminophen (TYLENOL) tablet 650 mg (has no administration in time range)    Or  acetaminophen (TYLENOL) suppository 650 mg (has no administration in time range)  ondansetron (ZOFRAN) tablet 4 mg (has no administration in time range)    Or   ondansetron (ZOFRAN) injection 4 mg (has no administration in time range)  senna-docusate (Senokot-S) tablet 1 tablet (has no administration in time range)  cefTRIAXone (ROCEPHIN) 1 g in sodium chloride 0.9 % 100 mL IVPB (has no administration in time range)  azithromycin (ZITHROMAX) 500 mg in sodium chloride 0.9 % 250 mL IVPB (has no administration in time range)  heparin ADULT infusion 100 units/mL (25000 units/26250mL sodium chloride 0.45%) (900 Units/hr Intravenous New Bag/Given 06/27/18 1611)  pregabalin (LYRICA) capsule 50 mg (has no administration in time range)  QUEtiapine (SEROQUEL) tablet 50 mg (has no administration in time range)  LORazepam (ATIVAN) 2 MG/ML concentrated solution 0.5 mg (has no administration in time range)  traZODone (DESYREL) tablet 50 mg (has no administration in time range)  albuterol (PROVENTIL) (2.5 MG/3ML) 0.083% nebulizer solution 5 mg (5 mg Nebulization Given 06/27/18 1222)  ipratropium-albuterol (DUONEB) 0.5-2.5 (3) MG/3ML nebulizer solution 3 mL (3 mLs Nebulization Given 06/27/18 1222)  iohexol (OMNIPAQUE) 350 MG/ML injection 75 mL (75 mLs Intravenous Contrast Given 06/27/18 1339)  cefTRIAXone (ROCEPHIN) 1 g in sodium chloride 0.9 % 100 mL IVPB (0 g Intravenous Stopped 06/27/18 1527)  azithromycin (ZITHROMAX) 500 mg in sodium chloride 0.9 % 250 mL IVPB (0 mg Intravenous Stopped 06/27/18 1709)  heparin bolus via infusion 3,200 Units (3,200 Units Intravenous Bolus from Bag 06/27/18 1610)    Mobility walks with device

## 2018-06-27 NOTE — ED Notes (Signed)
Patient transported to CT 

## 2018-06-27 NOTE — ED Notes (Signed)
Pt started on 2L O2 via nasal canula.

## 2018-06-27 NOTE — ED Triage Notes (Signed)
Pt arrives to ED via [facility] from [facility], In wth c/c of SoB. PHS reported to EMS that she had initial O2 sat of 80% on RA and could only get her to 84% with 5L via non-rebreather. EMS reports pt breathing easily during transport and O2 sat was 95% on RA. Transport vitals: 133/70, p80, NSR. Pt alert upon arrival and oriented to self. Confused on date and location. Pt has Hx of dementia.

## 2018-06-27 NOTE — ED Notes (Signed)
ED Provider at bedside. 

## 2018-06-27 NOTE — ED Notes (Signed)
Patient transported to X-ray 

## 2018-06-27 NOTE — ED Notes (Signed)
Pt returned from xray

## 2018-06-27 NOTE — ED Notes (Signed)
Pts daughter stated her mother was going to have a hard time staying still for the CT and asked if there was something that we could give to calm her down. Dr Lenard LancePaduchowski notified.

## 2018-06-28 LAB — CBC
HEMATOCRIT: 31.8 % — AB (ref 36.0–46.0)
Hemoglobin: 10.1 g/dL — ABNORMAL LOW (ref 12.0–15.0)
MCH: 27.8 pg (ref 26.0–34.0)
MCHC: 31.8 g/dL (ref 30.0–36.0)
MCV: 87.6 fL (ref 80.0–100.0)
Platelets: 187 10*3/uL (ref 150–400)
RBC: 3.63 MIL/uL — ABNORMAL LOW (ref 3.87–5.11)
RDW: 13.6 % (ref 11.5–15.5)
WBC: 13.6 10*3/uL — ABNORMAL HIGH (ref 4.0–10.5)
nRBC: 0 % (ref 0.0–0.2)

## 2018-06-28 LAB — INFLUENZA PANEL BY PCR (TYPE A & B)
Influenza A By PCR: NEGATIVE
Influenza B By PCR: NEGATIVE

## 2018-06-28 LAB — BASIC METABOLIC PANEL
Anion gap: 6 (ref 5–15)
BUN: 24 mg/dL — ABNORMAL HIGH (ref 8–23)
CHLORIDE: 100 mmol/L (ref 98–111)
CO2: 27 mmol/L (ref 22–32)
Calcium: 8 mg/dL — ABNORMAL LOW (ref 8.9–10.3)
Creatinine, Ser: 0.78 mg/dL (ref 0.44–1.00)
GFR calc Af Amer: >60 mL/min (ref >60)
GFR calc non Af Amer: >60 mL/min (ref >60)
Glucose, Bld: 138 mg/dL — ABNORMAL HIGH (ref 70–99)
Potassium: 3.3 mmol/L — ABNORMAL LOW (ref 3.5–5.1)
Sodium: 133 mmol/L — ABNORMAL LOW (ref 135–145)

## 2018-06-28 LAB — HEPARIN LEVEL (UNFRACTIONATED): Heparin Unfractionated: 1.12 IU/mL — ABNORMAL HIGH (ref 0.30–0.70)

## 2018-06-28 LAB — PROCALCITONIN: Procalcitonin: 0.59 ng/mL

## 2018-06-28 MED ORDER — APIXABAN 5 MG PO TABS
10.0000 mg | ORAL_TABLET | Freq: Two times a day (BID) | ORAL | Status: DC
  Administered 2018-06-28 – 2018-06-30 (×5): 10 mg via ORAL
  Filled 2018-06-28 (×5): qty 2

## 2018-06-28 MED ORDER — POTASSIUM CHLORIDE CRYS ER 20 MEQ PO TBCR
40.0000 meq | EXTENDED_RELEASE_TABLET | Freq: Once | ORAL | Status: AC
  Administered 2018-06-28: 40 meq via ORAL
  Filled 2018-06-28: qty 2

## 2018-06-28 MED ORDER — IRBESARTAN 150 MG PO TABS
300.0000 mg | ORAL_TABLET | Freq: Every day | ORAL | Status: DC
  Filled 2018-06-28: qty 2

## 2018-06-28 MED ORDER — APIXABAN 5 MG PO TABS: 5.0000 mg | ORAL_TABLET | Freq: Two times a day (BID) | ORAL | Status: DC

## 2018-06-28 MED ORDER — METOPROLOL SUCCINATE ER 25 MG PO TB24
25.0000 mg | ORAL_TABLET | Freq: Every day | ORAL | Status: DC
  Administered 2018-06-28 – 2018-06-30 (×3): 25 mg via ORAL
  Filled 2018-06-28 (×3): qty 1

## 2018-06-28 NOTE — Progress Notes (Signed)
[employer] - [place] at [facility]                                                                                                                                                                                  Patient Demographics   Christine Lin, is a 85 y.o. female, DOB - 02/02/1932, DGL:[id number]  Admit date - 06/27/2018   Admitting Physician Ihor Austin, MD  Outpatient Primary MD for the patient is Center, [facility]   LOS - 1  Subjective: Patient having cough and shortness of breath Patient feels weak but denies any chest pain   Review of Systems:   CONSTITUTIONAL: No documented fever.  Positive fatigue, positive weakness. No weight gain, no weight loss.  EYES: No blurry or double vision.  ENT: No tinnitus. No postnasal drip. No redness of the oropharynx.  RESPIRATORY: Positive cough, no wheeze, no hemoptysis.  Positive dyspnea.  CARDIOVASCULAR: No chest pain. No orthopnea. No palpitations. No syncope.  GASTROINTESTINAL: No nausea, no vomiting or diarrhea. No abdominal pain. No melena or hematochezia.  GENITOURINARY: No dysuria or hematuria.  ENDOCRINE: No polyuria or nocturia. No heat or cold intolerance.  HEMATOLOGY: No anemia. No bruising. No bleeding.  INTEGUMENTARY: No rashes. No lesions.  MUSCULOSKELETAL: No arthritis. No swelling. No gout.  NEUROLOGIC: No numbness, tingling, or ataxia. No seizure-type activity.  PSYCHIATRIC: No anxiety. No insomnia. No ADD.    Vitals:   Vitals:   06/27/18 1936 06/27/18 2150 06/28/18 0615 06/28/18 1212  BP: (!) 134/95 (!) 144/79 (!) 150/123 114/77  Pulse: 83 73 61 (!) 58  Resp: (!) 24  20 16   Temp: (!) 100.5 F (38.1 C)  98 F (36.7 C) 97.8 F (36.6 C)  TempSrc: Oral  Oral Oral  SpO2: 98%  100% 100%  Weight:      Height:        Wt Readings from Last 3 Encounters:  06/27/18 63.5 kg  04/23/17 59.4 kg  06/05/15 54.4 kg     Intake/Output Summary (Last 24 hours) at 06/28/2018  1437 Last data filed at 06/28/2018 0615 Gross per 24 hour  Intake 450 ml  Output -  Net 450 ml    Physical Exam:   GENERAL: Pleasant-appearing in no apparent distress.  HEAD, EYES, EARS, NOSE AND THROAT: Atraumatic, normocephalic. Extraocular muscles are intact. Pupils equal and reactive to light. Sclerae anicteric. No conjunctival injection. No oro-pharyngeal erythema.  NECK: Supple. There is no jugular venous distention. No bruits, no lymphadenopathy, no thyromegaly.  HEART: Regular rate and rhythm,. No murmurs, no rubs, no clicks.  LUNGS: Clear to auscultation bilaterally. No rales or rhonchi. No wheezes.  ABDOMEN: Soft, flat, nontender,  nondistended. Has good bowel sounds. No hepatosplenomegaly appreciated.  EXTREMITIES: No evidence of any cyanosis, clubbing, or peripheral edema.  +2 pedal and radial pulses bilaterally.  NEUROLOGIC: The patient is alert, awake, oriented to person not place or time with no focal motor or sensory deficits appreciated bilaterally.  SKIN: Moist and warm with no rashes appreciated.  Psych: Not anxious, depressed LN: No inguinal LN enlargement    Antibiotics   Anti-infectives (From admission, onward)   Start     Dose/Rate Route Frequency Ordered Stop   06/28/18 1600  azithromycin (ZITHROMAX) 500 mg in sodium chloride 0.9 % 250 mL IVPB     500 mg 250 mL/hr over 60 Minutes Intravenous Every 24 hours 06/27/18 1432     06/28/18 1500  cefTRIAXone (ROCEPHIN) 1 g in sodium chloride 0.9 % 100 mL IVPB     1 g 200 mL/hr over 30 Minutes Intravenous Every 24 hours 06/27/18 1431     06/27/18 1430  cefTRIAXone (ROCEPHIN) 1 g in sodium chloride 0.9 % 100 mL IVPB     1 g 200 mL/hr over 30 Minutes Intravenous  Once 06/27/18 1421 06/27/18 1527   06/27/18 1430  azithromycin (ZITHROMAX) 500 mg in sodium chloride 0.9 % 250 mL IVPB     500 mg 250 mL/hr over 60 Minutes Intravenous  Once 06/27/18 1421 06/27/18 1709      Medications   Scheduled Meds: . amLODipine  5  mg Oral Daily  . apixaban  10 mg Oral BID   Followed by  . [START ON 07/05/2018] apixaban  5 mg Oral BID  . donepezil  5 mg Oral QHS  . irbesartan  300 mg Oral Daily  . labetalol  200 mg Oral BID  . metoprolol succinate  25 mg Oral Daily  . mirtazapine  15 mg Oral QHS  . pregabalin  50 mg Oral BID  . QUEtiapine  50 mg Oral QHS  . sodium chloride flush  3 mL Intravenous Q12H   Continuous Infusions: . sodium chloride    . azithromycin    . cefTRIAXone (ROCEPHIN)  IV     PRN Meds:.sodium chloride, acetaminophen **OR** acetaminophen, LORazepam, ondansetron **OR** ondansetron (ZOFRAN) IV, senna-docusate, sodium chloride flush, traZODone   Data Review:   Micro Results Recent Results (from the past 240 hour(s))  Blood culture (routine x 2)     Status: None (Preliminary result)   Collection Time: 06/27/18  2:39 PM  Result Value Ref Range Status   Specimen Description BLOOD RIGHT ARM  Final   Special Requests   Final    BOTTLES DRAWN AEROBIC AND ANAEROBIC Blood Culture results may not be optimal due to an excessive volume of blood received in culture bottles   Culture   Final    NO GROWTH < 24 HOURS Performed at [facility], [address] [place], [place] [address]    Report Status PENDING  Incomplete  Blood culture (routine x 2)     Status: None (Preliminary result)   Collection Time: 06/27/18  2:39 PM  Result Value Ref Range Status   Specimen Description BLOOD BLOOD LEFT FOREARM  Final   Special Requests   Final    BOTTLES DRAWN AEROBIC AND ANAEROBIC Blood Culture adequate volume   Culture   Final    NO GROWTH < 24 HOURS Performed at [facility], [address] [place], [place] [address]    Report Status PENDING  Incomplete    Radiology Reports Dg Chest 2 View  Result Date: 06/27/2018 CLINICAL DATA:  Acute shortness of breath. EXAM: CHEST - 2 VIEW COMPARISON:  04/23/2017. FINDINGS: AP ERECT and LATERAL images were obtained. Stable marked  cardiomegaly. Thoracic aorta tortuous and atherosclerotic. Hilar and mediastinal contours otherwise unremarkable. Stable chronic elevation of the RIGHT hemidiaphragm with chronic scar/atelectasis involving the RIGHT LOWER LOBE. Lungs otherwise clear. No localized airspace consolidation. No pleural effusions. No pneumothorax. Normal pulmonary vascularity. Degenerative changes involving the thoracic and UPPER lumbar spine. IMPRESSION: Stable marked cardiomegaly. No acute cardiopulmonary disease. Stable chronic elevation of the RIGHT hemidiaphragm with chronic scar/atelectasis involving the RIGHT LOWER LOBE. Electronically Signed   By: Hulan Saas M.D.   On: 06/27/2018 12:15   Ct Angio Chest Pe W And/or Wo Contrast  Result Date: 06/27/2018 CLINICAL DATA:  Shortness of breath. Confusion. Decreased oxygen saturation. EXAM: CT ANGIOGRAPHY CHEST WITH CONTRAST TECHNIQUE: Multidetector CT imaging of the chest was performed using the standard protocol during bolus administration of intravenous contrast. Multiplanar CT image reconstructions and MIPs were obtained to evaluate the vascular anatomy. CONTRAST:  47mL OMNIPAQUE IOHEXOL 350 MG/ML SOLN COMPARISON:  Chest radiograph 06/27/2018 FINDINGS: Cardiovascular: The heart is enlarged. Ascending thoracic aorta measures 3.7 cm. Thoracic aortic vascular calcifications. Small amount of fluid superior pericardial recess. Adequate opacification of the pulmonary arterial system. Marked motion artifact limits evaluation. Peripheral nonocclusive thrombus is demonstrated within the proximal left lower lobe pulmonary artery (image 117; series 5) extending into the left lower lobe segmental pulmonary arteries. Mediastinum/Nodes: No enlarged axillary, mediastinal or hilar lymphadenopathy. Lungs/Pleura: Central airways are patent. Patchy consolidative opacities within the right middle, right lower left lower lobes. No pleural effusion or pneumothorax. Upper Abdomen: No acute process.  Musculoskeletal: Thoracic spine degenerative changes. No aggressive or acute appearing osseous lesions. Review of the MIP images confirms the above findings. IMPRESSION: 1. Pulmonary embolus demonstrated within the proximal left lower lobe pulmonary artery extending into the left lower lobe segmental and subsegmental pulmonary arteries. Given the peripheral nature, a component of this may be chronic in etiology. 2. Patchy consolidative opacities within the right middle and right lower lobes may represent atelectasis or infection. 3. Exam limited secondary to motion artifact. 4. Critical Value/emergent results were called by telephone at the time of interpretation on 06/27/2018 at 2:10 pm to Dr. Minna Antis , who verbally acknowledged these results. Electronically Signed   By: Annia Belt M.D.   On: 06/27/2018 14:16   US Venous Img Lower Bilateral  Result Date: 06/27/2018 CLINICAL DATA:  Acute pulmonary embolus, leg pain and edema EXAM: BILATERAL LOWER EXTREMITY VENOUS DOPPLER ULTRASOUND TECHNIQUE: Gray-scale sonography with graded compression, as well as color Doppler and duplex ultrasound were performed to evaluate the lower extremity deep venous systems from the level of the common femoral vein and including the common femoral, femoral, profunda femoral, popliteal and calf veins including the posterior tibial, peroneal and gastrocnemius veins when visible. The superficial great saphenous vein was also interrogated. Spectral Doppler was utilized to evaluate flow at rest and with distal augmentation maneuvers in the common femoral, femoral and popliteal veins. COMPARISON:  None. FINDINGS: RIGHT LOWER EXTREMITY Common Femoral Vein: No evidence of thrombus. Normal compressibility, respiratory phasicity and response to augmentation. Saphenofemoral Junction: No evidence of thrombus. Normal compressibility and flow on color Doppler imaging. Profunda Femoral Vein: No evidence of thrombus. Normal compressibility  and flow on color Doppler imaging. Femoral Vein: No evidence of thrombus. Normal compressibility, respiratory phasicity and response to augmentation. Popliteal Vein: No evidence of thrombus. Normal compressibility, respiratory  phasicity and response to augmentation. Calf Veins: Posterior tibial vein appears patent and compressible. Peroneal vein not visualized. Superficial Great Saphenous Vein: No evidence of thrombus. Normal compressibility. Venous Reflux:  None. Other Findings:  None. LEFT LOWER EXTREMITY Common Femoral Vein: Hypoechoic intraluminal thrombus appearing nonocclusive. Vessel is noncompressible. Saphenofemoral Junction: No evidence of thrombus. Normal compressibility and flow on color Doppler imaging. Profunda Femoral Vein: No evidence of thrombus. Normal compressibility and flow on color Doppler imaging. Femoral Vein: Hypoechoic thrombus. Vessel is partially compressible. Thrombus is nonocclusive throughout the femoral vein Popliteal Vein: Residual hypoechoic thrombus appearing partially compressible and nonocclusive. Calf Veins: Popliteal thrombus does extend into the posterior tibial veins appearing nonocclusive. Superficial Great Saphenous Vein: No evidence of thrombus. Normal compressibility. Venous Reflux:  None. Other Findings:  None. IMPRESSION: Positive exam for residual left common femoral, femoral, popliteal and tibial nonocclusive DVT. No significant right lower extremity DVT. Electronically Signed   By: Judie Petit.  Shick M.D.   On: 06/27/2018 16:04     CBC Recent Labs  Lab 06/27/18 1153 06/28/18 0412  WBC 8.8 13.6*  HGB 12.1 10.1*  HCT 38.4 31.8*  PLT 227 187  MCV 88.7 87.6  MCH 27.9 27.8  MCHC 31.5 31.8  RDW 13.6 13.6    Chemistries  Recent Labs  Lab 06/27/18 1153 06/28/18 0412  NA 138 133*  K 3.7 3.3*  CL 99 100  CO2 28 27  GLUCOSE 229* 138*  BUN 20 24*  CREATININE 0.95 0.78  CALCIUM 9.3 8.0*  AST 26  --   ALT 17  --   ALKPHOS 62  --   BILITOT 0.5  --     ------------------------------------------------------------------------------------------------------------------ estimated creatinine clearance is 47.1 mL/min (by C-G formula based on SCr of 0.78 mg/dL). ------------------------------------------------------------------------------------------------------------------ No results for input(s): HGBA1C in the last 72 hours. ------------------------------------------------------------------------------------------------------------------ No results for input(s): CHOL, HDL, LDLCALC, TRIG, CHOLHDL, LDLDIRECT in the last 72 hours. ------------------------------------------------------------------------------------------------------------------ No results for input(s): TSH, T4TOTAL, T3FREE, THYROIDAB in the last 72 hours.  Invalid input(s): FREET3 ------------------------------------------------------------------------------------------------------------------ No results for input(s): VITAMINB12, FOLATE, FERRITIN, TIBC, IRON, RETICCTPCT in the last 72 hours.  Coagulation profile Recent Labs  Lab 06/27/18 1153  INR 0.95    No results for input(s): DDIMER in the last 72 hours.  Cardiac Enzymes Recent Labs  Lab 06/27/18 1153  TROPONINI <0.03   ------------------------------------------------------------------------------------------------------------------ Invalid input(s): POCBNP    Assessment & Plan   85 year old early female patient with history of herpes zoster, diverticulosis of colon, atherosclerotic heart disease presented to the emergency room for shortness of breath and cough  -Acute on chronic pulmonary embolism/also lower extremity DVT  discontinue IV heparin start patient on Eliquis  -Community-acquired pneumonia Continue IV Rocephin and Zithromax antibiotics Follow-up cultures  Procalcitonin level is high Flu test was checked is negative  -Acute respiratory distress with hypoxia Secondary to pulmonary edema  and pneumonia Oxygen via nasal cannula  -Generalized weakness patient will need PT evaluation  -Hypokalemia replace potassium   -DVT prophylaxis On full dose anticoagulation with heparin      Code Status Orders  (From admission, onward)         Start     Ordered   06/27/18 1446  Do not attempt resuscitation (DNR)  Continuous    Question Answer Comment  In the event of cardiac or respiratory ARREST Do not call a "code blue"   In the event of cardiac or respiratory ARREST Do not perform Intubation, CPR, defibrillation or ACLS   In the event  of cardiac or respiratory ARREST Use medication by any route, position, wound care, and other measures to relive pain and suffering. May use oxygen, suction and manual treatment of airway obstruction as needed for comfort.      06/27/18 1445        Code Status History    This patient has a current code status but no historical code status.    Advance Directive Documentation     Most Recent Value  Type of Advance Directive  Out of facility DNR (pink MOST or yellow form)  Pre-existing out of facility DNR order (yellow form or pink MOST form)  -  "MOST" Form in Place?  -           Consults  none  DVT Prophylaxis  Lovenox   Lab Results  Component Value Date   PLT 187 06/28/2018     Time Spent in minutes    Greater than 50% of time spent in care coordination and counseling patient regarding the condition and plan of care.   Auburn Bilberry M.D on 06/28/2018 at 2:37 PM  Between 7am to 6pm - Pager - [phone]  After 6pm go to [email or web address] - Social research officer, government  [employer]   Office  [phone]

## 2018-06-28 NOTE — Progress Notes (Signed)
Received a call MD was visiting patient he discussed with daughters regarding patient going home with home hospice.  Daughter states that patient has been very uncomfortable and pain daily basis and would like her to be comfortable.  Plan is for her to go home with hospice tomorrow

## 2018-06-28 NOTE — Progress Notes (Signed)
PT Cancellation Note  Patient Details Name: Christine Lin MRN: [id number] DOB: [birth year]   Cancelled Treatment:    Reason Eval/Treat Not Completed: Medical issues which prohibited therapy.  PT consult received.  Chart reviewed.  Pt noted to be (+) for PE on imaging yesterday and also (+) for L LE DVT (anticoagulation medication started yesterday 06/27/18 at 1611).  Per PT protocol, will hold pt for physical therapy for 48 hours (from time of anticoagulation initiation) and then attempt PT evaluation.  Hendricks Limes, PT 06/28/18, 3:24 PM [phone]

## 2018-06-29 NOTE — Progress Notes (Signed)
Staff attempted to assist the patient from the bed to the recliner but her daughter requested that we not get her up

## 2018-06-29 NOTE — Evaluation (Signed)
Physical Therapy Evaluation Patient Details Name: Christine Lin MRN: [id number] DOB: [birth year] Today's Date: 06/29/2018   History of Present Illness  85 y.o. female with a known history of diverticulosis of colon, hypertension, herpes zoster in the past was found to be in respiratory distress by [facility] physician program personnel when they visited the patient's home.  Admitted in respiratory distress, + PE.  Clinical Impression  Pt pleasantly confused t/o the session, daughter present and helpful in keeping pt on tasks.  She still needed a lot of extra cuing and assist and though she was willing to try ultimately just needed direct cuing assist to get her to understand our goals.  She ultimately was able to ambulate ~200 ft with constant cuing for direction, walker use and cadence, but once she got going she showed good safety.  Pt should be able to go home with her current 24/7 available assistance with the [facility]ACE program, sitters and family.    Follow Up Recommendations [facility]    Equipment Recommendations  None recommended by PT    Recommendations for Other Services       Precautions / Restrictions Precautions Precautions: Fall Restrictions Weight Bearing Restrictions: No      Mobility  Bed Mobility Overal bed mobility: Needs Assistance Bed Mobility: Supine to Sit     Supine to sit: Mod assist     General bed mobility comments: Pt likely could have gotten to sitting w/ less assist had she understood  Transfers Overall transfer level: Needs assistance Equipment used: Rolling walker (2 wheeled) Transfers: Sit to/from Stand Sit to Stand: Min assist         General transfer comment: Again pt likely could have done more on her own if she had undersood the goal  Ambulation/Gait Ambulation/Gait assistance: Min guard Gait Distance (Feet): 200 Feet Assistive device: Rolling walker (2 wheeled)       General Gait Details: Pt initially with slow,  unsteady, stop-go cadence, but once she felt comfortable she was able to maintain consistent cadence at relatively appropriate speed. Essentially near baseline once comfortable.  Stairs            Wheelchair Mobility    Modified Rankin (Stroke Patients Only)       Balance Overall balance assessment: Needs assistance   Sitting balance-Leahy Scale: Fair       Standing balance-Leahy Scale: Good                               Pertinent Vitals/Pain Pain Assessment: No/denies pain    Home Living Family/patient expects to be discharged to:: Private residence Living Arrangements: Children Available Help at Discharge: Family;Personal care attendant;Available 24 hours/day Type of Home: House Home Access: Ramped entrance       Home Equipment: Walker - 4 wheels;Walker - standard;Wheelchair - manual      Prior Function Level of Independence: Needs assistance         Comments: needs assist due to mental status, can often get around home with RW and only supervision, does use w/c on her "bad days"     Hand Dominance        Extremity/Trunk Assessment   Upper Extremity Assessment Upper Extremity Assessment: Generalized weakness;Overall [facility] for tasks assessed;Difficult to assess due to impaired cognition    Lower Extremity Assessment Lower Extremity Assessment: Generalized weakness;Overall [facility] for tasks assessed;Difficult to assess due to impaired cognition  Communication   Communication: Receptive difficulties;Expressive difficulties  Cognition Arousal/Alertness: Awake/alert Behavior During Therapy: Flat affect Overall Cognitive Status: History of cognitive impairments - at baseline                                        General Comments      Exercises     Assessment/Plan    PT Assessment Patient needs continued PT services  PT Problem List Decreased strength;Decreased range of motion;Decreased activity  tolerance;Decreased balance;Decreased coordination;Decreased mobility;Decreased cognition;Decreased knowledge of use of DME;Decreased safety awareness;Decreased knowledge of precautions       PT Treatment Interventions DME instruction;Gait training;Functional mobility training;Therapeutic activities;Therapeutic exercise;Balance training;Neuromuscular re-education;Cognitive remediation;Patient/family education    PT Goals (Current goals can be found in the Care Plan section)  Acute Rehab PT Goals Patient Stated Goal: go home PT Goal Formulation: With patient/family Time For Goal Achievement: 07/13/18 Potential to Achieve Goals: Good    Frequency Min 2X/week   Barriers to discharge        Co-evaluation               AM-PAC PT "6 Clicks" Mobility  Outcome Measure Help needed turning from your back to your side while in a flat bed without using bedrails?: A Little Help needed moving from lying on your back to sitting on the side of a flat bed without using bedrails?: A Lot Help needed moving to and from a bed to a chair (including a wheelchair)?: A Little Help needed standing up from a chair using your arms (e.g., wheelchair or bedside chair)?: A Little Help needed to walk in hospital room?: A Little Help needed climbing 3-5 steps with a railing? : A Little 6 Click Score: 17    End of Session Equipment Utilized During Treatment: Gait belt Activity Tolerance: Patient tolerated treatment well Patient left: with chair alarm set;with call bell/phone within reach;with family/visitor present   PT Visit Diagnosis: Muscle weakness (generalized) (M62.81);Difficulty in walking, not elsewhere classified (R26.2)    Time: [id number] PT Time Calculation (min) (ACUTE ONLY): 26 min   Charges:   PT Evaluation $PT Eval Low Complexity: 1 Low PT Treatments $Gait Training: 8-22 mins        Malachi ProGalen R Babette Stum, DPT 06/29/2018, 6:27 PM

## 2018-06-29 NOTE — Care Management Note (Addendum)
Case Management Note  Patient Details  Name: Christine Lin MRN: [id number] Date of Birth: 26-Jul-1931  Subjective/Objective:     Patient is a [facility] patient.  RNCM to room and P[facility] and family at bedside.  Spoke with Dr. Shelda Pal in room and introduced myself as her [facility] and offered assistance if needed.  The plan was for patient to discharge home with hospice but family has changed their mind and patient will discharge with po antibiotics and anticoagulants.   No needs identified by CM.                 Action/Plan:   Expected Discharge Date:                  Expected Discharge Plan:  Home w Hospice Care  In-House Referral:     Discharge planning Services  CM Consult  Post Acute Care Choice:    Choice offered to:     DME Arranged:    DME Agency:     HH Arranged:    HH Agency:     Status of Service:  Completed, signed off  If discussed at [employer] of Stay Meetings, dates discussed:    Additional Comments:  Sherren Kerns, RN 06/29/2018, 12:24 PM

## 2018-06-29 NOTE — Progress Notes (Signed)
[employer] - [place] at [facility]                                                                                                                                                                                  Patient Demographics   Christine Lin, is a 85 y.o. female, DOB - March 05, 1932, RUE:[id number]  Admit date - 06/27/2018   Admitting Physician Ihor Austin, MD  Outpatient Primary MD for the patient is [facility], [facility]   LOS - 2  Subjective: Patient doing better, no chest pain   Review of Systems:   CONSTITUTIONAL: No documented fever.  Positive fatigue, positive weakness. No weight gain, no weight loss.  EYES: No blurry or double vision.  ENT: No tinnitus. No postnasal drip. No redness of the oropharynx.  RESPIRATORY: Positive cough, no wheeze, no hemoptysis.  Positive dyspnea.  CARDIOVASCULAR: No chest pain. No orthopnea. No palpitations. No syncope.  GASTROINTESTINAL: No nausea, no vomiting or diarrhea. No abdominal pain. No melena or hematochezia.  GENITOURINARY: No dysuria or hematuria.  ENDOCRINE: No polyuria or nocturia. No heat or cold intolerance.  HEMATOLOGY: No anemia. No bruising. No bleeding.  INTEGUMENTARY: No rashes. No lesions.  MUSCULOSKELETAL: No arthritis. No swelling. No gout.  NEUROLOGIC: No numbness, tingling, or ataxia. No seizure-type activity.  PSYCHIATRIC: No anxiety. No insomnia. No ADD.    Vitals:   Vitals:   06/28/18 1943 06/29/18 0616 06/29/18 1000 06/29/18 1341  BP: 126/83 (!) 159/82 139/80 137/79  Pulse: 60 61 64 (!) 59  Resp: 18 20    Temp: 97.9 F (36.6 C) 97.7 F (36.5 C)  98.2 F (36.8 C)  TempSrc: Oral Oral  Oral  SpO2: 100% 93%  96%  Weight:      Height:        Wt Readings from Last 3 Encounters:  06/27/18 63.5 kg  04/23/17 59.4 kg  06/05/15 54.4 kg     Intake/Output Summary (Last 24 hours) at 06/29/2018 1400 Last data filed at 06/29/2018 0326 Gross per 24 hour  Intake 369.86 ml   Output -  Net 369.86 ml    Physical Exam:   GENERAL: Pleasant-appearing in no apparent distress.  HEAD, EYES, EARS, NOSE AND THROAT: Atraumatic, normocephalic. Extraocular muscles are intact. Pupils equal and reactive to light. Sclerae anicteric. No conjunctival injection. No oro-pharyngeal erythema.  NECK: Supple. There is no jugular venous distention. No bruits, no lymphadenopathy, no thyromegaly.  HEART: Regular rate and rhythm,. No murmurs, no rubs, no clicks.  LUNGS: Clear to auscultation bilaterally. No rales or rhonchi. No wheezes.  ABDOMEN: Soft, flat, nontender, nondistended. Has good bowel sounds. No hepatosplenomegaly appreciated.  EXTREMITIES: No evidence of  any cyanosis, clubbing, or peripheral edema.  +2 pedal and radial pulses bilaterally.  NEUROLOGIC: The patient is alert, awake, oriented to person not place or time with no focal motor or sensory deficits appreciated bilaterally.  SKIN: Moist and warm with no rashes appreciated.  Psych: Not anxious, depressed LN: No inguinal LN enlargement    Antibiotics   Anti-infectives (From admission, onward)   Start     Dose/Rate Route Frequency Ordered Stop   06/28/18 1600  azithromycin (ZITHROMAX) 500 mg in sodium chloride 0.9 % 250 mL IVPB     500 mg 250 mL/hr over 60 Minutes Intravenous Every 24 hours 06/27/18 1432     06/28/18 1500  cefTRIAXone (ROCEPHIN) 1 g in sodium chloride 0.9 % 100 mL IVPB     1 g 200 mL/hr over 30 Minutes Intravenous Every 24 hours 06/27/18 1431     06/27/18 1430  cefTRIAXone (ROCEPHIN) 1 g in sodium chloride 0.9 % 100 mL IVPB     1 g 200 mL/hr over 30 Minutes Intravenous  Once 06/27/18 1421 06/27/18 1527   06/27/18 1430  azithromycin (ZITHROMAX) 500 mg in sodium chloride 0.9 % 250 mL IVPB     500 mg 250 mL/hr over 60 Minutes Intravenous  Once 06/27/18 1421 06/27/18 1709      Medications   Scheduled Meds: . amLODipine  5 mg Oral Daily  . apixaban  10 mg Oral BID   Followed by  . [START ON  07/05/2018] apixaban  5 mg Oral BID  . metoprolol succinate  25 mg Oral Daily  . mirtazapine  15 mg Oral QHS  . pregabalin  50 mg Oral BID  . QUEtiapine  50 mg Oral QHS  . sodium chloride flush  3 mL Intravenous Q12H   Continuous Infusions: . sodium chloride    . azithromycin Stopped (06/28/18 1852)  . cefTRIAXone (ROCEPHIN)  IV Stopped (06/28/18 1719)   PRN Meds:.sodium chloride, acetaminophen **OR** acetaminophen, LORazepam, ondansetron **OR** ondansetron (ZOFRAN) IV, senna-docusate, sodium chloride flush, traZODone   Data Review:   Micro Results Recent Results (from the past 240 hour(s))  Blood culture (routine x 2)     Status: None (Preliminary result)   Collection Time: 06/27/18  2:39 PM  Result Value Ref Range Status   Specimen Description BLOOD RIGHT ARM  Final   Special Requests   Final    BOTTLES DRAWN AEROBIC AND ANAEROBIC Blood Culture results may not be optimal due to an excessive volume of blood received in culture bottles   Culture   Final    NO GROWTH 2 DAYS Performed at [facility], [address] [place], [place] [address]    Report Status PENDING  Incomplete  Blood culture (routine x 2)     Status: None (Preliminary result)   Collection Time: 06/27/18  2:39 PM  Result Value Ref Range Status   Specimen Description BLOOD BLOOD LEFT FOREARM  Final   Special Requests   Final    BOTTLES DRAWN AEROBIC AND ANAEROBIC Blood Culture adequate volume   Culture   Final    NO GROWTH 2 DAYS Performed at [facility], [address] [place], [place] [address]    Report Status PENDING  Incomplete    Radiology Reports Dg Chest 2 View  Result Date: 06/27/2018 CLINICAL DATA:  Acute shortness of breath. EXAM: CHEST - 2 VIEW COMPARISON:  04/23/2017. FINDINGS: AP ERECT and LATERAL images were obtained. Stable marked cardiomegaly. Thoracic aorta tortuous and atherosclerotic. Hilar and  mediastinal contours otherwise unremarkable. Stable chronic elevation  of the RIGHT hemidiaphragm with chronic scar/atelectasis involving the RIGHT LOWER LOBE. Lungs otherwise clear. No localized airspace consolidation. No pleural effusions. No pneumothorax. Normal pulmonary vascularity. Degenerative changes involving the thoracic and UPPER lumbar spine. IMPRESSION: Stable marked cardiomegaly. No acute cardiopulmonary disease. Stable chronic elevation of the RIGHT hemidiaphragm with chronic scar/atelectasis involving the RIGHT LOWER LOBE. Electronically Signed   By: Hulan Saashomas  Lawrence M.D.   On: 06/27/2018 12:15   Ct Angio Chest Pe W And/or Wo Contrast  Result Date: 06/27/2018 CLINICAL DATA:  Shortness of breath. Confusion. Decreased oxygen saturation. EXAM: CT ANGIOGRAPHY CHEST WITH CONTRAST TECHNIQUE: Multidetector CT imaging of the chest was performed using the standard protocol during bolus administration of intravenous contrast. Multiplanar CT image reconstructions and MIPs were obtained to evaluate the vascular anatomy. CONTRAST:  75mL OMNIPAQUE IOHEXOL 350 MG/ML SOLN COMPARISON:  Chest radiograph 06/27/2018 FINDINGS: Cardiovascular: The heart is enlarged. Ascending thoracic aorta measures 3.7 cm. Thoracic aortic vascular calcifications. Small amount of fluid superior pericardial recess. Adequate opacification of the pulmonary arterial system. Marked motion artifact limits evaluation. Peripheral nonocclusive thrombus is demonstrated within the proximal left lower lobe pulmonary artery (image 117; series 5) extending into the left lower lobe segmental pulmonary arteries. Mediastinum/Nodes: No enlarged axillary, mediastinal or hilar lymphadenopathy. Lungs/Pleura: Central airways are patent. Patchy consolidative opacities within the right middle, right lower left lower lobes. No pleural effusion or pneumothorax. Upper Abdomen: No acute process. Musculoskeletal: Thoracic spine degenerative changes. No aggressive or acute appearing osseous lesions. Review of the MIP images confirms  the above findings. IMPRESSION: 1. Pulmonary embolus demonstrated within the proximal left lower lobe pulmonary artery extending into the left lower lobe segmental and subsegmental pulmonary arteries. Given the peripheral nature, a component of this may be chronic in etiology. 2. Patchy consolidative opacities within the right middle and right lower lobes may represent atelectasis or infection. 3. Exam limited secondary to motion artifact. 4. Critical Value/emergent results were called by telephone at the time of interpretation on 06/27/2018 at 2:10 pm to Dr. Minna AntisKEVIN PADUCHOWSKI , who verbally acknowledged these results. Electronically Signed   By: Annia Beltrew  Davis M.D.   On: 06/27/2018 14:16   [place] Venous Img Lower Bilateral  Result Date: 06/27/2018 CLINICAL DATA:  Acute pulmonary embolus, leg pain and edema EXAM: BILATERAL LOWER EXTREMITY VENOUS DOPPLER ULTRASOUND TECHNIQUE: Gray-scale sonography with graded compression, as well as color Doppler and duplex ultrasound were performed to evaluate the lower extremity deep venous systems from the level of the common femoral vein and including the common femoral, femoral, profunda femoral, popliteal and calf veins including the posterior tibial, peroneal and gastrocnemius veins when visible. The superficial great saphenous vein was also interrogated. Spectral Doppler was utilized to evaluate flow at rest and with distal augmentation maneuvers in the common femoral, femoral and popliteal veins. COMPARISON:  None. FINDINGS: RIGHT LOWER EXTREMITY Common Femoral Vein: No evidence of thrombus. Normal compressibility, respiratory phasicity and response to augmentation. Saphenofemoral Junction: No evidence of thrombus. Normal compressibility and flow on color Doppler imaging. Profunda Femoral Vein: No evidence of thrombus. Normal compressibility and flow on color Doppler imaging. Femoral Vein: No evidence of thrombus. Normal compressibility, respiratory phasicity and response to  augmentation. Popliteal Vein: No evidence of thrombus. Normal compressibility, respiratory phasicity and response to augmentation. Calf Veins: Posterior tibial vein appears patent and compressible. Peroneal vein not visualized. Superficial Great Saphenous Vein: No evidence of thrombus. Normal compressibility. Venous Reflux:  None. Other Findings:  None.  LEFT LOWER EXTREMITY Common Femoral Vein: Hypoechoic intraluminal thrombus appearing nonocclusive. Vessel is noncompressible. Saphenofemoral Junction: No evidence of thrombus. Normal compressibility and flow on color Doppler imaging. Profunda Femoral Vein: No evidence of thrombus. Normal compressibility and flow on color Doppler imaging. Femoral Vein: Hypoechoic thrombus. Vessel is partially compressible. Thrombus is nonocclusive throughout the femoral vein Popliteal Vein: Residual hypoechoic thrombus appearing partially compressible and nonocclusive. Calf Veins: Popliteal thrombus does extend into the posterior tibial veins appearing nonocclusive. Superficial Great Saphenous Vein: No evidence of thrombus. Normal compressibility. Venous Reflux:  None. Other Findings:  None. IMPRESSION: Positive exam for residual left common femoral, femoral, popliteal and tibial nonocclusive DVT. No significant right lower extremity DVT. Electronically Signed   By: Judie Petit.  Shick M.D.   On: 06/27/2018 16:04     CBC Recent Labs  Lab 06/27/18 1153 06/28/18 0412  WBC 8.8 13.6*  HGB 12.1 10.1*  HCT 38.4 31.8*  PLT 227 187  MCV 88.7 87.6  MCH 27.9 27.8  MCHC 31.5 31.8  RDW 13.6 13.6    Chemistries  Recent Labs  Lab 06/27/18 1153 06/28/18 0412  NA 138 133*  K 3.7 3.3*  CL 99 100  CO2 28 27  GLUCOSE 229* 138*  BUN 20 24*  CREATININE 0.95 0.78  CALCIUM 9.3 8.0*  AST 26  --   ALT 17  --   ALKPHOS 62  --   BILITOT 0.5  --    ------------------------------------------------------------------------------------------------------------------ estimated creatinine  clearance is 47.1 mL/min (by C-G formula based on SCr of 0.78 mg/dL). ------------------------------------------------------------------------------------------------------------------ No results for input(s): HGBA1C in the last 72 hours. ------------------------------------------------------------------------------------------------------------------ No results for input(s): CHOL, HDL, LDLCALC, TRIG, CHOLHDL, LDLDIRECT in the last 72 hours. ------------------------------------------------------------------------------------------------------------------ No results for input(s): TSH, T4TOTAL, T3FREE, THYROIDAB in the last 72 hours.  Invalid input(s): FREET3 ------------------------------------------------------------------------------------------------------------------ No results for input(s): VITAMINB12, FOLATE, FERRITIN, TIBC, IRON, RETICCTPCT in the last 72 hours.  Coagulation profile Recent Labs  Lab 06/27/18 1153  INR 0.95    No results for input(s): DDIMER in the last 72 hours.  Cardiac Enzymes Recent Labs  Lab 06/27/18 1153  TROPONINI <0.03   ------------------------------------------------------------------------------------------------------------------ Invalid input(s): POCBNP    Assessment & Plan   85 year old early female patient with history of herpes zoster, diverticulosis of colon, atherosclerotic heart disease presented to the emergency room for shortness of breath and cough  -Acute on chronic pulmonary embolism/also lower extremity DVT  Continue oral Eliquis   -Community-acquired pneumonia Continue IV Rocephin and Zithromax antibiotics Follow-up cultures  Flu test was checked is negative  -Acute respiratory distress with hypoxia Secondary to pulmonary edema and pneumonia Oxygen via nasal cannula  -Generalized weakness patient will need PT evaluation  -Hypokalemia replace potassium  -DVT prophylaxis On full dose anticoagulation with  heparin      Code Status Orders  (From admission, onward)         Start     Ordered   06/27/18 1446  Do not attempt resuscitation (DNR)  Continuous    Question Answer Comment  In the event of cardiac or respiratory ARREST Do not call a "code blue"   In the event of cardiac or respiratory ARREST Do not perform Intubation, CPR, defibrillation or ACLS   In the event of cardiac or respiratory ARREST Use medication by any route, position, wound care, and other measures to relive pain and suffering. May use oxygen, suction and manual treatment of airway obstruction as needed for comfort.      06/27/18 1445  Code Status History    This patient has a current code status but no historical code status.    Advance Directive Documentation     Most Recent Value  Type of Advance Directive  Out of facility DNR (pink MOST or yellow form)  Pre-existing out of facility DNR order (yellow form or pink MOST form)  -  "MOST" Form in Place?  -           Consults  none  DVT Prophylaxis  Lovenox   Lab Results  Component Value Date   PLT 187 06/28/2018     Time Spent in minutes    Greater than 50% of time spent in care coordination and counseling patient regarding the condition and plan of care.   Auburn Bilberry M.D on 06/29/2018 at 2:00 PM  Between 7am to 6pm - Pager - [phone]  After 6pm go to [email or web address] - Social research officer, government  [employer]   Office  [phone]

## 2018-06-29 NOTE — Progress Notes (Signed)
Advanced care plan.  Purpose of the Encounter: CODE STATUS  Parties in Attendance: Patient herself 2 daughters  Patient's Decision Capacity: Not intact  Subjective/Patient's story:  Patient is 85 year old admitted with pulmonary embolism and pneumonia, I received a call from the pace program who was visiting the patient's stating that he wanted to send patient home with home hospice.  And was for patient to go home with home hospice today however when I entered the room daughters wanted to discuss her treatment plan.  They feel uncomfortable discharging patient home without any anticoagulation or antibiotics.  I spent extended amount of time with the patient and family regarding goals of care  Objective/Medical story  I discussed with the family regarding options to go home with home hospice.  With comfort measures.  They state that they would like patient to be treated for both pneumonia and pulmonary embolism.  Goals of care determination:  Continue aggressive therapy for now patient will be discharged tomorrow on oral anticoagulation and antibiotics.  Patient can follow-up with her pace MD to decide on future goals of care and possible comfort measures in the future  CODE STATUS: DNR   Time spent discussing advanced care planning: 16 minutes

## 2018-06-30 MED ORDER — APIXABAN 5 MG PO TABS: 5.0000 mg | ORAL_TABLET | Freq: Two times a day (BID) | ORAL | Status: AC

## 2018-06-30 MED ORDER — APIXABAN 5 MG PO TABS: 10.0000 mg | ORAL_TABLET | Freq: Two times a day (BID) | ORAL | 0 refills | Status: AC

## 2018-06-30 MED ORDER — CEFUROXIME AXETIL 500 MG PO TABS: 500.0000 mg | ORAL_TABLET | Freq: Two times a day (BID) | ORAL | 0 refills | Status: AC

## 2018-06-30 NOTE — Discharge Summary (Signed)
[facility] - [place] [facility], 85 y.o., DOB 14-Oct-1931, MRN [id number]. Admission date: 06/27/2018 Discharge Date 06/30/2018 Primary MD Center, [facility] Admitting Physician Ihor Austin, MD  Admission Diagnosis  DVT (deep venous thrombosis) (HCC) [I82.409] Pulmonary embolism, unspecified chronicity, unspecified pulmonary embolism type, unspecified whether acute cor pulmonale present [facility]) [I26.99]  Discharge Diagnosis   Active Problems:   Pulmonary embolism (HCC) Community-acquired pneumonia Acute respiratory distress with hypoxia due to above 2       Hospital Course 85 year old early female patient with history of herpes zoster, diverticulosis of colon, atherosclerotic heart disease presented to the emergency room for shortness of breath and cough.  Patient was evaluated in the ED with CT scan which showed a pulmonary embolism as well as pneumonia.  Patient was treated with IV heparin initially and then Eliquis.  He was also treated for pneumonia with IV antibiotics.  [facility] was [facility].  Initially plan was for her to go home with hospice however family wanted to continue treatment therefore she is continued on treatment with antibiotics and anticoagulation.  She is currently stable to be discharged.          Consults  None  Significant Tests:  See full reports for all details     Dg Chest 2 View  Result Date: 06/27/2018 CLINICAL DATA:  Acute shortness of breath. EXAM: CHEST - 2 VIEW COMPARISON:  04/23/2017. FINDINGS: AP ERECT and LATERAL images were obtained. Stable marked cardiomegaly. Thoracic aorta tortuous and atherosclerotic. Hilar and mediastinal contours otherwise unremarkable. Stable chronic elevation of the RIGHT hemidiaphragm with chronic scar/atelectasis involving the RIGHT LOWER LOBE. Lungs otherwise clear. No localized airspace consolidation. No pleural effusions. No pneumothorax. Normal pulmonary  vascularity. Degenerative changes involving the thoracic and UPPER lumbar spine. IMPRESSION: Stable marked cardiomegaly. No acute cardiopulmonary disease. Stable chronic elevation of the RIGHT hemidiaphragm with chronic scar/atelectasis involving the RIGHT LOWER LOBE. Electronically Signed   By: Hulan Saas M.D.   On: 06/27/2018 12:15   Ct Angio Chest Pe W And/or Wo Contrast  Result Date: 06/27/2018 CLINICAL DATA:  Shortness of breath. Confusion. Decreased oxygen saturation. EXAM: CT ANGIOGRAPHY CHEST WITH CONTRAST TECHNIQUE: Multidetector CT imaging of the chest was performed using the standard protocol during bolus administration of intravenous contrast. Multiplanar CT image reconstructions and MIPs were obtained to evaluate the vascular anatomy. CONTRAST:  28mL OMNIPAQUE IOHEXOL 350 MG/ML SOLN COMPARISON:  Chest radiograph 06/27/2018 FINDINGS: Cardiovascular: The heart is enlarged. Ascending thoracic aorta measures 3.7 cm. Thoracic aortic vascular calcifications. Small amount of fluid superior pericardial recess. Adequate opacification of the pulmonary arterial system. Marked motion artifact limits evaluation. Peripheral nonocclusive thrombus is demonstrated within the proximal left lower lobe pulmonary artery (image 117; series 5) extending into the left lower lobe segmental pulmonary arteries. Mediastinum/Nodes: No enlarged axillary, mediastinal or hilar lymphadenopathy. Lungs/Pleura: Central airways are patent. Patchy consolidative opacities within the right middle, right lower left lower lobes. No pleural effusion or pneumothorax. Upper Abdomen: No acute process. Musculoskeletal: Thoracic spine degenerative changes. No aggressive or acute appearing osseous lesions. Review of the MIP images confirms the above findings. IMPRESSION: 1. Pulmonary embolus demonstrated within the proximal left lower lobe pulmonary artery extending into the left lower lobe segmental and subsegmental pulmonary arteries.  Given the peripheral nature, a component of this may be chronic in etiology. 2. Patchy consolidative opacities within the right middle and right lower lobes may represent atelectasis or infection. 3. Exam limited secondary to  motion artifact. 4. Critical Value/emergent results were called by telephone at the time of interpretation on 06/27/2018 at 2:10 pm to Dr. Minna Antis , who verbally acknowledged these results. Electronically Signed   By: Annia Belt M.D.   On: 06/27/2018 14:16   US Venous Img Lower Bilateral  Result Date: 06/27/2018 CLINICAL DATA:  Acute pulmonary embolus, leg pain and edema EXAM: BILATERAL LOWER EXTREMITY VENOUS DOPPLER ULTRASOUND TECHNIQUE: Gray-scale sonography with graded compression, as well as color Doppler and duplex ultrasound were performed to evaluate the lower extremity deep venous systems from the level of the common femoral vein and including the common femoral, femoral, profunda femoral, popliteal and calf veins including the posterior tibial, peroneal and gastrocnemius veins when visible. The superficial great saphenous vein was also interrogated. Spectral Doppler was utilized to evaluate flow at rest and with distal augmentation maneuvers in the common femoral, femoral and popliteal veins. COMPARISON:  None. FINDINGS: RIGHT LOWER EXTREMITY Common Femoral Vein: No evidence of thrombus. Normal compressibility, respiratory phasicity and response to augmentation. Saphenofemoral Junction: No evidence of thrombus. Normal compressibility and flow on color Doppler imaging. Profunda Femoral Vein: No evidence of thrombus. Normal compressibility and flow on color Doppler imaging. Femoral Vein: No evidence of thrombus. Normal compressibility, respiratory phasicity and response to augmentation. Popliteal Vein: No evidence of thrombus. Normal compressibility, respiratory phasicity and response to augmentation. Calf Veins: Posterior tibial vein appears patent and compressible.  Peroneal vein not visualized. Superficial Great Saphenous Vein: No evidence of thrombus. Normal compressibility. Venous Reflux:  None. Other Findings:  None. LEFT LOWER EXTREMITY Common Femoral Vein: Hypoechoic intraluminal thrombus appearing nonocclusive. Vessel is noncompressible. Saphenofemoral Junction: No evidence of thrombus. Normal compressibility and flow on color Doppler imaging. Profunda Femoral Vein: No evidence of thrombus. Normal compressibility and flow on color Doppler imaging. Femoral Vein: Hypoechoic thrombus. Vessel is partially compressible. Thrombus is nonocclusive throughout the femoral vein Popliteal Vein: Residual hypoechoic thrombus appearing partially compressible and nonocclusive. Calf Veins: Popliteal thrombus does extend into the posterior tibial veins appearing nonocclusive. Superficial Great Saphenous Vein: No evidence of thrombus. Normal compressibility. Venous Reflux:  None. Other Findings:  None. IMPRESSION: Positive exam for residual left common femoral, femoral, popliteal and tibial nonocclusive DVT. No significant right lower extremity DVT. Electronically Signed   By: Judie Petit.  Shick M.D.   On: 06/27/2018 16:04       Today   Subjective:   Vernard Gambles patient doing better denies any complaints Objective:   Blood pressure (!) 160/91, pulse (!) 56, temperature 97.6 F (36.4 C), temperature source Oral, resp. rate 18, height 5\' 5"  (1.651 m), weight 63.5 kg, SpO2 99 %.  .  Intake/Output Summary (Last 24 hours) at 06/30/2018 1343 Last data filed at 06/30/2018 1034 Gross per 24 hour  Intake 794.35 ml  Output 0 ml  Net 794.35 ml    Exam VITAL SIGNS: Blood pressure (!) 160/91, pulse (!) 56, temperature 97.6 F (36.4 C), temperature source Oral, resp. rate 18, height 5\' 5"  (1.651 m), weight 63.5 kg, SpO2 99 %.  GENERAL:  85 y.o.-year-old patient lying in the bed with no acute distress.  EYES: Pupils equal, round, reactive to light and accommodation. No scleral icterus.  Extraocular muscles intact.  HEENT: Head atraumatic, normocephalic. Oropharynx and nasopharynx clear.  NECK:  Supple, no jugular venous distention. No thyroid enlargement, no tenderness.  LUNGS: Normal breath sounds bilaterally, no wheezing, rales,rhonchi or crepitation. No use of accessory muscles of respiration.  CARDIOVASCULAR: S1, S2 normal. No murmurs, rubs, or  gallops.  ABDOMEN: Soft, nontender, nondistended. Bowel sounds present. No organomegaly or mass.  EXTREMITIES: No pedal edema, cyanosis, or clubbing.  NEUROLOGIC: Cranial nerves II through XII are intact. Muscle strength 5/5 in all extremities. Sensation intact. Gait not checked.  PSYCHIATRIC: The patient is alert and oriented x 3.  SKIN: No obvious rash, lesion, or ulcer.   Data Review     CBC w Diff:  Lab Results  Component Value Date   WBC 13.6 (H) 06/28/2018   HGB 10.1 (L) 06/28/2018   HGB 13.1 12/19/2014   HCT 31.8 (L) 06/28/2018   HCT 39.9 12/19/2014   PLT 187 06/28/2018   PLT 191 12/19/2014   LYMPHOPCT 15.5 11/30/2013   MONOPCT 8.5 11/30/2013   EOSPCT 0.1 11/30/2013   BASOPCT 1.0 11/30/2013   CMP:  Lab Results  Component Value Date   NA 133 (L) 06/28/2018   NA 142 12/19/2014   K 3.3 (L) 06/28/2018   CL 100 06/28/2018   CO2 27 06/28/2018   BUN 24 (H) 06/28/2018   BUN 21 12/19/2014   CREATININE 0.78 06/28/2018   PROT 8.3 (H) 06/27/2018   PROT 7.8 12/19/2014   ALBUMIN 3.9 06/27/2018   ALBUMIN 4.3 12/19/2014   BILITOT 0.5 06/27/2018   BILITOT 0.6 12/19/2014   ALKPHOS 62 06/27/2018   AST 26 06/27/2018   ALT 17 06/27/2018  .  Micro Results Recent Results (from the past 240 hour(s))  Blood culture (routine x 2)     Status: None (Preliminary result)   Collection Time: 06/27/18  2:39 PM  Result Value Ref Range Status   Specimen Description BLOOD RIGHT ARM  Final   Special Requests   Final    BOTTLES DRAWN AEROBIC AND ANAEROBIC Blood Culture results may not be optimal due to an excessive volume of  blood received in culture bottles   Culture   Final    NO GROWTH 3 DAYS Performed at [facility], [address] [place], [place] [address]    Report Status PENDING  Incomplete  Blood culture (routine x 2)     Status: None (Preliminary result)   Collection Time: 06/27/18  2:39 PM  Result Value Ref Range Status   Specimen Description BLOOD BLOOD LEFT FOREARM  Final   Special Requests   Final    BOTTLES DRAWN AEROBIC AND ANAEROBIC Blood Culture adequate volume   Culture   Final    NO GROWTH 3 DAYS Performed at [facility], [address] [place], [place] [address]    Report Status PENDING  Incomplete        Code Status Orders  (From admission, onward)         Start     Ordered   06/27/18 1446  Do not attempt resuscitation (DNR)  Continuous    Question Answer Comment  In the event of cardiac or respiratory ARREST Do not call a "code blue"   In the event of cardiac or respiratory ARREST Do not perform Intubation, CPR, defibrillation or ACLS   In the event of cardiac or respiratory ARREST Use medication by any route, position, wound care, and other measures to relive pain and suffering. May use oxygen, suction and manual treatment of airway obstruction as needed for comfort.      06/27/18 1445        Code Status History    This patient has a current code status but no historical code status.    Advance Directive Documentation  Most Recent Value  Type of Advance Directive  Out of facility DNR (pink MOST or yellow form)  Pre-existing out of facility DNR order (yellow form or pink MOST form)  -  "MOST" Form in Place?  -          Follow-up Information    Center, [facility]. Go in 4 days.   Contact information: [address] [place] [address] [phone]           Discharge Medications   Allergies as of 06/30/2018      Reactions   Sulfa Antibiotics       Medication List    STOP taking these medications    donepezil 5 MG tablet Commonly known as:  ARICEPT   labetalol 200 MG tablet Commonly known as:  NORMODYNE   Olmesartan-amLODIPine-HCTZ 40-10-25 MG Tabs Commonly known as:  TRIBENZOR   oseltamivir 30 MG capsule Commonly known as:  TAMIFLU     TAKE these medications   apixaban 5 MG Tabs tablet Commonly known as:  ELIQUIS Take 2 tablets (10 mg total) by mouth 2 (two) times daily for 4 days.   apixaban 5 MG Tabs tablet Commonly known as:  ELIQUIS Take 1 tablet (5 mg total) by mouth 2 (two) times daily. Start taking on:  July 05, 2018   cefUROXime 500 MG tablet Commonly known as:  CEFTIN Take 1 tablet (500 mg total) by mouth 2 (two) times daily for 5 days.   LORazepam 2 MG/ML concentrated solution Commonly known as:  ATIVAN Take 0.5 mg by mouth every 8 (eight) hours as needed for anxiety (or agitation).   metoprolol succinate 25 MG 24 hr tablet Commonly known as:  TOPROL-XL Take 25 mg by mouth daily.   mirtazapine 15 MG tablet Commonly known as:  REMERON Take 15 mg by mouth at bedtime.   morphine CONCENTRATE 10 mg / 0.5 ml concentrated solution Take 4 mg by mouth every 2 (two) hours as needed for moderate pain or severe pain.   pregabalin 50 MG capsule Commonly known as:  LYRICA Take 50 mg by mouth 2 (two) times daily. For "gritty feeling"   QUEtiapine 50 MG tablet Commonly known as:  SEROQUEL Take 50 mg by mouth at bedtime.   traZODone 50 MG tablet Commonly known as:  DESYREL Take 50 mg by mouth at bedtime as needed.          Total Time in preparing paper work, data evaluation and todays exam - 35 minutes  Auburn BilberryShreyang Lavonne Cass M.D on 06/30/2018 at 1:43 PM [facility]   Office  [phone]

## 2018-06-30 NOTE — Progress Notes (Signed)
Pt discharged per MD order. IV removed. Discharge instructions reviewed with pts daughter. [facility] is set up with [facility]. Daughter will contact them once she arrives home. Questions answered to daughters satisfaction. Pt taken out in her wheelchair by home and helped into car by staff.

## 2018-06-30 NOTE — Care Management Important Message (Signed)
Copy of signed Medicare IM left with patient in room. 

## 2018-06-30 NOTE — Care Management (Signed)
Per bedside RN.  [facility] MD was on the floor this morning and relayed to RN that patient would discharge with "comfort care".  All equipment and services have been arranged, and [facility] would be transporting home at 1230

## 2018-07-02 LAB — CULTURE, BLOOD (ROUTINE X 2)
Culture: NO GROWTH
Culture: NO GROWTH
Special Requests: ADEQUATE

## 2018-07-03 ENCOUNTER — Telehealth: Payer: Self-pay

## 2018-07-03 NOTE — Telephone Encounter (Signed)
Flagged on EMMI report for having questions about discharge papers and unfilled prescriptions.   Called and spoke with patient's daughter, Mackey Birchwood as patient unavailable.  She mentioned she thought they didn't receive discharge papers, but found out her sister received them. As for prescriptions, she mentioned one was sent to [facility] when she had wanted it to go to [facility], however she is working to get it filled.  No questions or concerns at this time. I thanked her for her time and informed her they would receive one more automated call checking in over the next few days.

## 2018-09-13 DEATH — deceased

## 2019-01-29 IMAGING — CT CT ANGIO CHEST
2 of 6 series · 18 of 46 positions shown · IV contrast (APPLIED)
Comparison: Chest radiograph 06/27/2018

CLINICAL DATA: Shortness of breath. Confusion. Decreased oxygen
saturation.

EXAM:
CT ANGIOGRAPHY CHEST WITH CONTRAST
TECHNIQUE: Multidetector CT imaging of the chest was performed using the
standard protocol during bolus administration of intravenous
contrast. Multiplanar CT image reconstructions and MIPs were
obtained to evaluate the vascular anatomy.
CONTRAST:  75mL OMNIPAQUE IOHEXOL 350 MG/ML SOLN

[Series 5: thins · axial · 0.61mm/px · z∈[+467,+699]mm · 16 of 256 slices shown]
[im 12/256  lung]
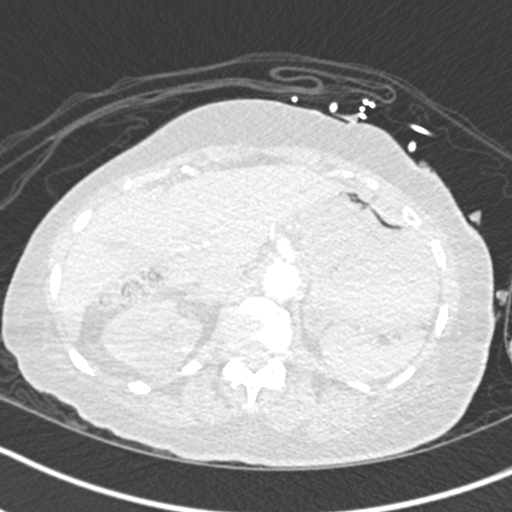
[im 34/256  soft-tissue]
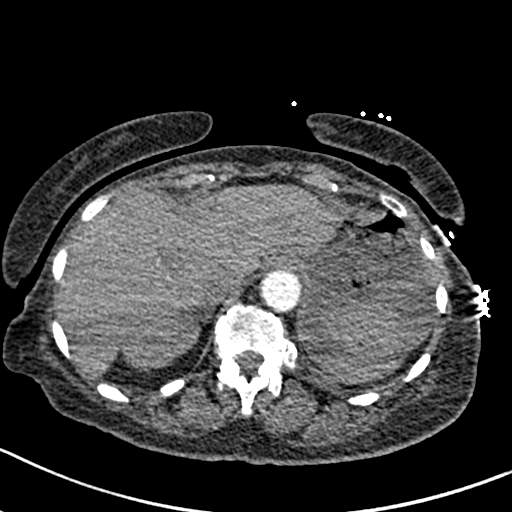
[im 45/256  lung]
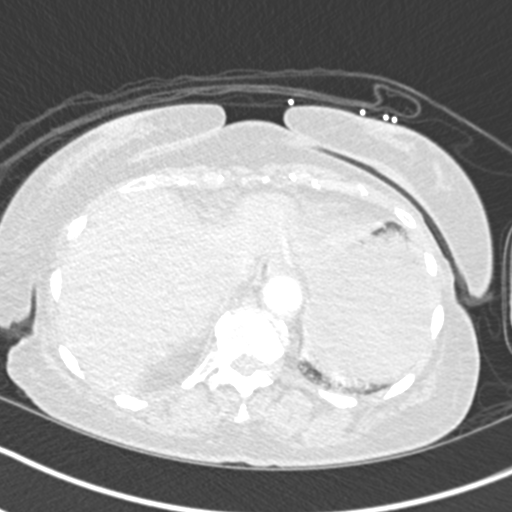
[im 56/256  soft-tissue]
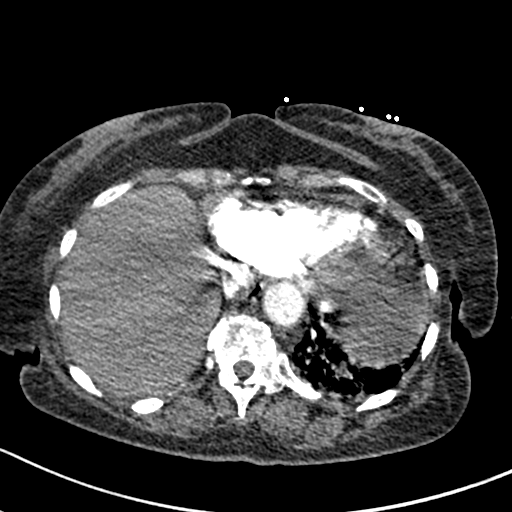
[im 78/256  lung]
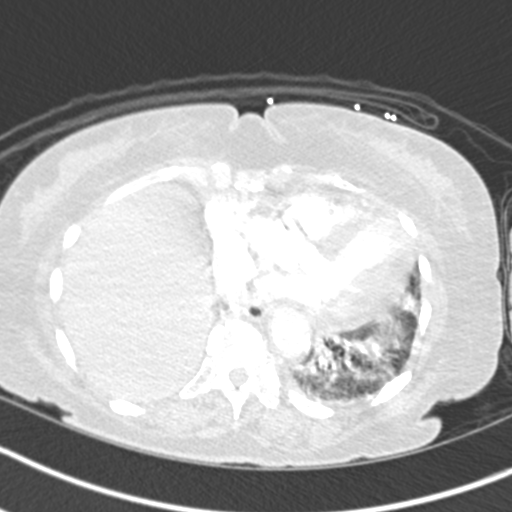
[im 89/256  soft-tissue]
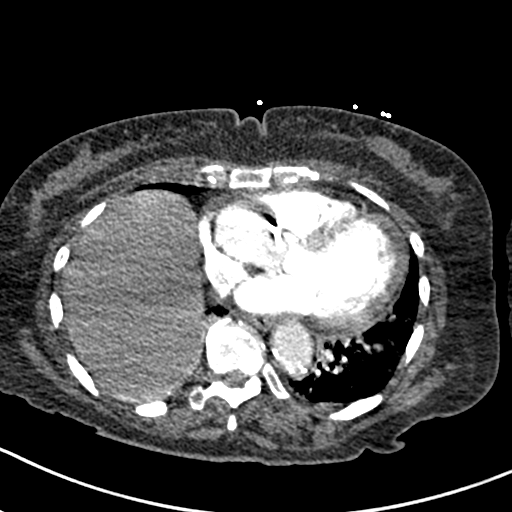
[im 100/256  lung]
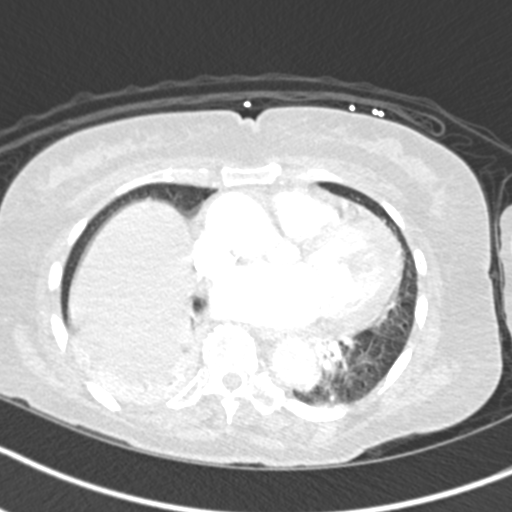
[im 122/256  soft-tissue]
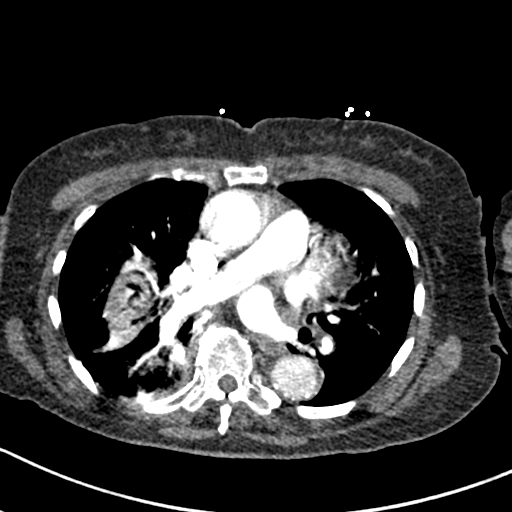
[im 134/256  lung]
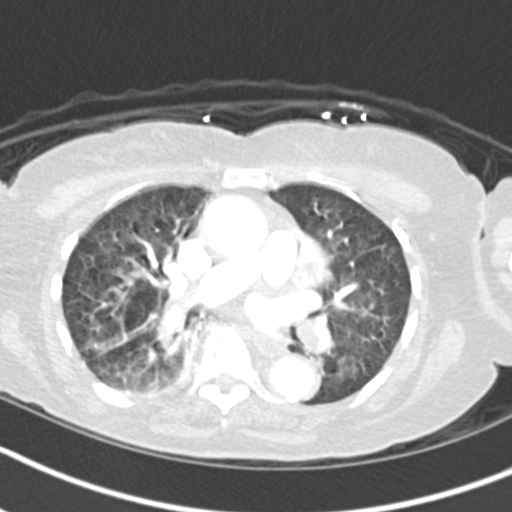
[im 156/256  soft-tissue]
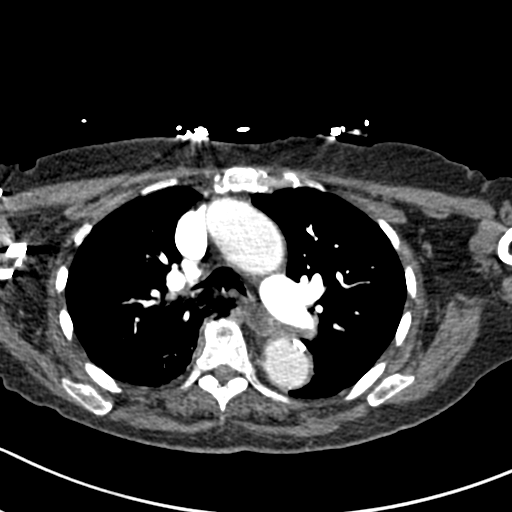
[im 167/256  lung]
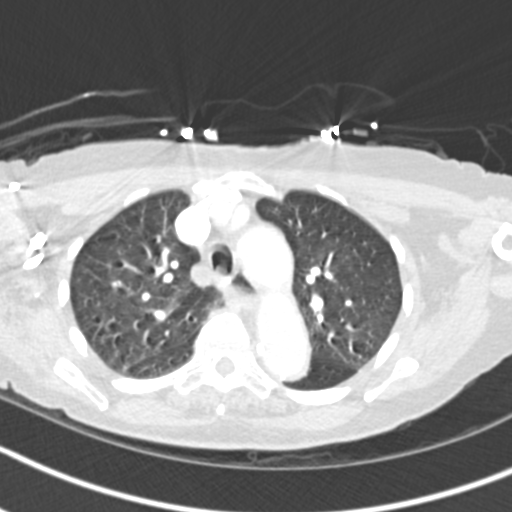
[im 178/256  soft-tissue]
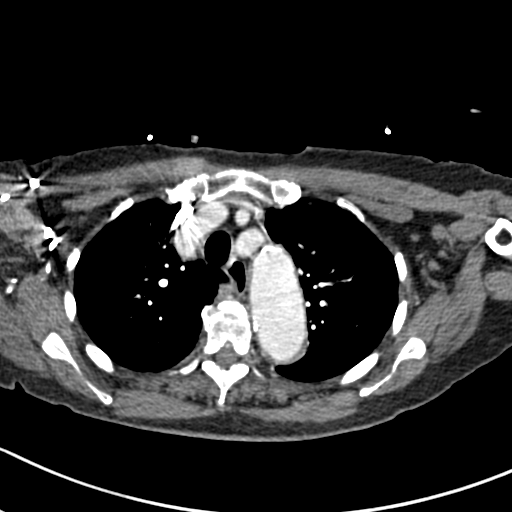
[im 200/256  lung]
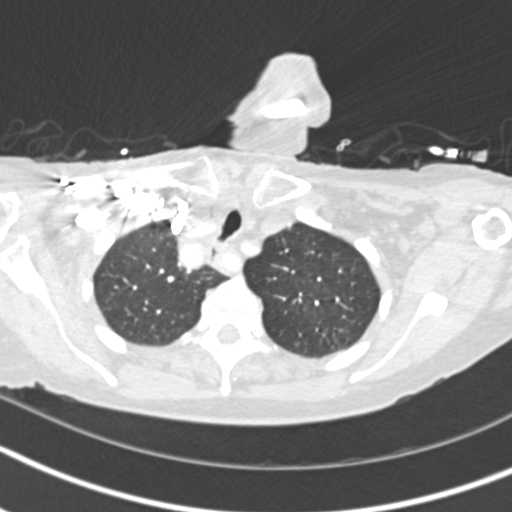
[im 211/256  soft-tissue]
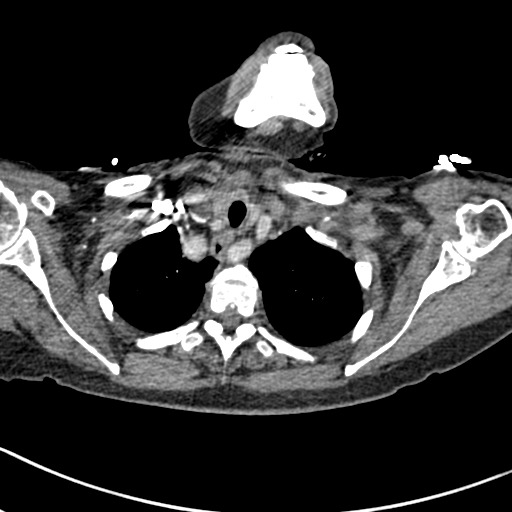
[im 222/256  lung]
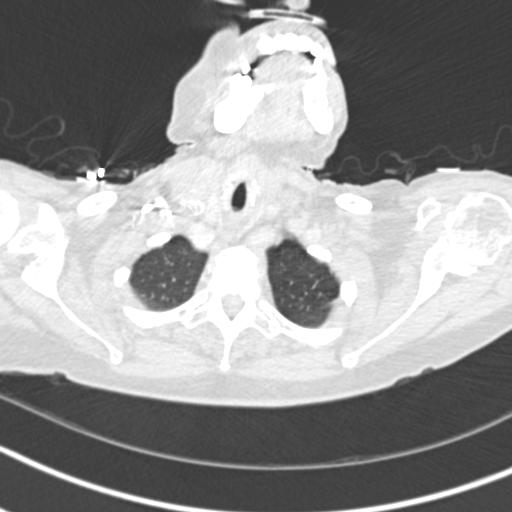
[im 244/256  soft-tissue]
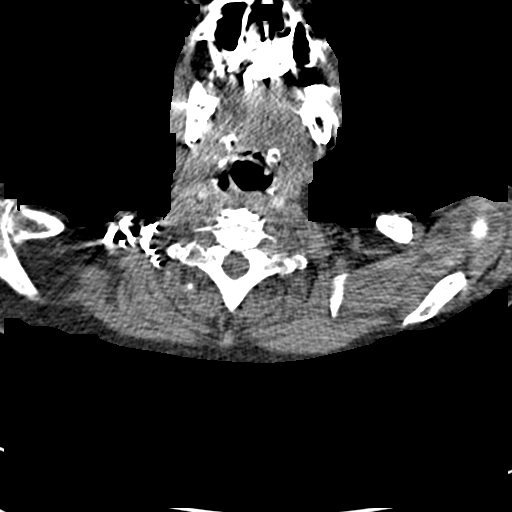

[Series 7: coronal mpr · coronal · 0.53mm/px · 2 of 106 slices shown]
[im 36/106  soft-tissue]
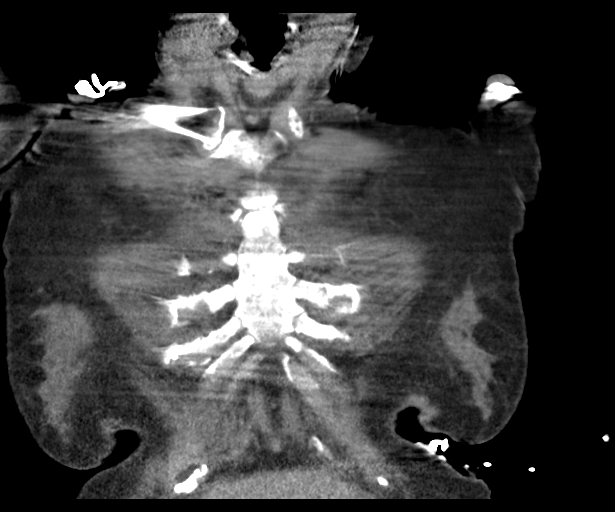
[im 71/106  soft-tissue]
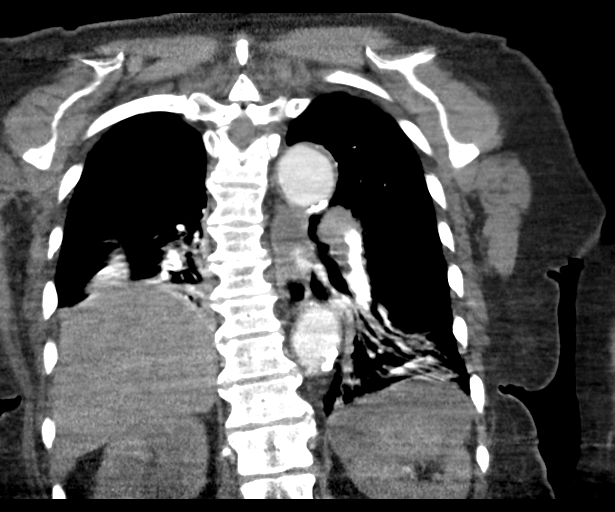

[18 of 46 positions shown; findings below may reference images not displayed]

FINDINGS: Cardiovascular: The heart is enlarged. Ascending thoracic aorta
measures 3.7 cm. Thoracic aortic vascular calcifications. Small
amount of fluid superior pericardial recess. Adequate opacification
of the pulmonary arterial system. Marked motion artifact limits
evaluation. Peripheral nonocclusive thrombus is demonstrated within
the proximal left lower lobe pulmonary artery (image 117; series 5)
extending into the left lower lobe segmental pulmonary arteries.

Mediastinum/Nodes: No enlarged axillary, mediastinal or hilar
lymphadenopathy.

Lungs/Pleura: Central airways are patent. Patchy consolidative
opacities within the right middle, right lower left lower lobes. No
pleural effusion or pneumothorax.

Upper Abdomen: No acute process.

Musculoskeletal: Thoracic spine degenerative changes. No aggressive
or acute appearing osseous lesions.

Review of the MIP images confirms the above findings.
IMPRESSION: 1. Pulmonary embolus demonstrated within the proximal left lower
lobe pulmonary artery extending into the left lower lobe segmental
and subsegmental pulmonary arteries. Given the peripheral nature, a
component of this may be chronic in etiology.
2. Patchy consolidative opacities within the right middle and right
lower lobes may represent atelectasis or infection.
3. Exam limited secondary to motion artifact.
4. Critical Value/emergent results were called by telephone at the
time of interpretation on 06/27/2018 at [DATE] to Dr. WIGHT
LICHTENSTEIN , who verbally acknowledged these results.
